# Patient Record
Sex: Female | Born: 1985 | Race: Black or African American | Hispanic: No | Marital: Married | Smoking: Never smoker
Health system: Southern US, Community
[De-identification: ages and names within clinical notes are randomized; demographics above are authoritative.]

## PROBLEM LIST (undated history)

## (undated) DIAGNOSIS — Z789 Other specified health status: Secondary | ICD-10-CM

## (undated) HISTORY — PX: NO PAST SURGERIES: SHX2092

---

## 2019-04-07 NOTE — L&D Delivery Note (Signed)
Delivery Note At 4:13 AM a viable and healthy female was delivered via Vaginal, Spontaneous (Presentation: Right Occiput Anterior).  APGAR: 8, 8; weight 3 lb 13.4 oz (1740 g).   Placenta status: Spontaneous, Intact.  Cord: 3 vessels with the following complications: None.  Cord pH: na  Anesthesia: Epidural Episiotomy: None Lacerations: 1st degree Suture Repair: 2.0 vicryl rapide Est. Blood Loss (mL): 100  Mom to postpartum.  Baby to NICU.  Khaliq Turay J 02/17/2020, 4:38 AM

## 2019-09-07 LAB — OB RESULTS CONSOLE RPR: RPR: NONREACTIVE

## 2019-09-07 LAB — OB RESULTS CONSOLE HEPATITIS B SURFACE ANTIGEN: Hepatitis B Surface Ag: NEGATIVE

## 2019-09-07 LAB — OB RESULTS CONSOLE RUBELLA ANTIBODY, IGM: Rubella: IMMUNE

## 2019-09-07 LAB — OB RESULTS CONSOLE HIV ANTIBODY (ROUTINE TESTING): HIV: NONREACTIVE

## 2019-09-15 ENCOUNTER — Inpatient Hospital Stay (HOSPITAL_COMMUNITY): Admission: AD | Admit: 2019-09-15 | Payer: 59 | Source: Home / Self Care | Admitting: Obstetrics

## 2020-02-05 ENCOUNTER — Encounter: Payer: Self-pay | Admitting: Student

## 2020-02-05 ENCOUNTER — Other Ambulatory Visit: Payer: Self-pay

## 2020-02-05 ENCOUNTER — Inpatient Hospital Stay (HOSPITAL_COMMUNITY)
Admission: AD | Admit: 2020-02-05 | Discharge: 2020-02-21 | DRG: 807 | Disposition: A | Discharge status: Home / Self Care | Payer: 59 | Attending: Obstetrics and Gynecology | Admitting: Obstetrics and Gynecology

## 2020-02-05 ENCOUNTER — Inpatient Hospital Stay (HOSPITAL_BASED_OUTPATIENT_CLINIC_OR_DEPARTMENT_OTHER): Payer: 59

## 2020-02-05 DIAGNOSIS — Z20822 Contact with and (suspected) exposure to covid-19: Secondary | ICD-10-CM | POA: Diagnosis present

## 2020-02-05 DIAGNOSIS — O365930 Maternal care for other known or suspected poor fetal growth, third trimester, not applicable or unspecified: Secondary | ICD-10-CM

## 2020-02-05 DIAGNOSIS — O1414 Severe pre-eclampsia complicating childbirth: Principal | ICD-10-CM | POA: Diagnosis present

## 2020-02-05 DIAGNOSIS — Z363 Encounter for antenatal screening for malformations: Secondary | ICD-10-CM | POA: Diagnosis not present

## 2020-02-05 DIAGNOSIS — O99214 Obesity complicating childbirth: Secondary | ICD-10-CM | POA: Diagnosis present

## 2020-02-05 DIAGNOSIS — Z3A33 33 weeks gestation of pregnancy: Secondary | ICD-10-CM | POA: Diagnosis not present

## 2020-02-05 DIAGNOSIS — Z362 Encounter for other antenatal screening follow-up: Secondary | ICD-10-CM | POA: Diagnosis not present

## 2020-02-05 DIAGNOSIS — O1413 Severe pre-eclampsia, third trimester: Secondary | ICD-10-CM | POA: Diagnosis not present

## 2020-02-05 DIAGNOSIS — Z3A32 32 weeks gestation of pregnancy: Secondary | ICD-10-CM | POA: Diagnosis not present

## 2020-02-05 DIAGNOSIS — R030 Elevated blood-pressure reading, without diagnosis of hypertension: Secondary | ICD-10-CM | POA: Diagnosis present

## 2020-02-05 DIAGNOSIS — O1410 Severe pre-eclampsia, unspecified trimester: Secondary | ICD-10-CM | POA: Diagnosis present

## 2020-02-05 DIAGNOSIS — O133 Gestational [pregnancy-induced] hypertension without significant proteinuria, third trimester: Secondary | ICD-10-CM

## 2020-02-05 HISTORY — DX: Other specified health status: Z78.9

## 2020-02-05 LAB — COMPREHENSIVE METABOLIC PANEL
ALT: 11 U/L (ref 0–44)
AST: 22 U/L (ref 15–41)
Albumin: 2.5 g/dL — ABNORMAL LOW (ref 3.5–5.0)
Alkaline Phosphatase: 89 U/L (ref 38–126)
Anion gap: 6 (ref 5–15)
BUN: 10 mg/dL (ref 6–20)
CO2: 24 mmol/L (ref 22–32)
Calcium: 8.5 mg/dL — ABNORMAL LOW (ref 8.9–10.3)
Chloride: 105 mmol/L (ref 98–111)
Creatinine, Ser: 0.76 mg/dL (ref 0.44–1.00)
GFR, Estimated: >60 mL/min (ref >60)
Glucose, Bld: 76 mg/dL (ref 70–99)
Potassium: 4.2 mmol/L (ref 3.5–5.1)
Sodium: 135 mmol/L (ref 135–145)
Total Bilirubin: 0.5 mg/dL (ref 0.3–1.2)
Total Protein: 5.4 g/dL — ABNORMAL LOW (ref 6.5–8.1)

## 2020-02-05 LAB — CBC
HCT: 35 % — ABNORMAL LOW (ref 36.0–46.0)
Hemoglobin: 11.8 g/dL — ABNORMAL LOW (ref 12.0–15.0)
MCH: 31.1 pg (ref 26.0–34.0)
MCHC: 33.7 g/dL (ref 30.0–36.0)
MCV: 92.1 fL (ref 80.0–100.0)
Platelets: 254 10*3/uL (ref 150–400)
RBC: 3.8 MIL/uL — ABNORMAL LOW (ref 3.87–5.11)
RDW: 13 % (ref 11.5–15.5)
WBC: 9.1 10*3/uL (ref 4.0–10.5)
nRBC: 0.6 % — ABNORMAL HIGH (ref 0.0–0.2)

## 2020-02-05 LAB — PROTEIN / CREATININE RATIO, URINE
Creatinine, Urine: 82.68 mg/dL
Protein Creatinine Ratio: 13.47 mg/mg{Cre} — ABNORMAL HIGH (ref 0.00–0.15)
Total Protein, Urine: 1114 mg/dL

## 2020-02-05 LAB — RESPIRATORY PANEL BY RT PCR (FLU A&B, COVID)
Influenza A by PCR: NEGATIVE
Influenza B by PCR: NEGATIVE
SARS Coronavirus 2 by RT PCR: NEGATIVE

## 2020-02-05 LAB — TYPE AND SCREEN
ABO/RH(D): O POS
Antibody Screen: NEGATIVE

## 2020-02-05 MED ORDER — DOCUSATE SODIUM 100 MG PO CAPS
100.0000 mg | ORAL_CAPSULE | Freq: Every day | ORAL | Status: DC
  Administered 2020-02-06 – 2020-02-15 (×10): 100 mg via ORAL
  Filled 2020-02-05 (×10): qty 1

## 2020-02-05 MED ORDER — LABETALOL HCL 5 MG/ML IV SOLN
40.0000 mg | INTRAVENOUS | Status: DC | PRN
  Administered 2020-02-05 – 2020-02-06 (×2): 40 mg via INTRAVENOUS
  Filled 2020-02-05 (×2): qty 8

## 2020-02-05 MED ORDER — DIPHENHYDRAMINE HCL 25 MG PO CAPS
25.0000 mg | ORAL_CAPSULE | Freq: Once | ORAL | Status: AC
  Administered 2020-02-05: 25 mg via ORAL
  Filled 2020-02-05: qty 1

## 2020-02-05 MED ORDER — LABETALOL HCL 5 MG/ML IV SOLN
20.0000 mg | INTRAVENOUS | Status: DC | PRN
  Administered 2020-02-05 – 2020-02-16 (×6): 20 mg via INTRAVENOUS
  Filled 2020-02-05 (×7): qty 4

## 2020-02-05 MED ORDER — ZOLPIDEM TARTRATE 5 MG PO TABS
5.0000 mg | ORAL_TABLET | Freq: Every evening | ORAL | Status: DC | PRN
  Administered 2020-02-06: 5 mg via ORAL
  Filled 2020-02-05: qty 1

## 2020-02-05 MED ORDER — HYDRALAZINE HCL 20 MG/ML IJ SOLN
10.0000 mg | Freq: Once | INTRAMUSCULAR | Status: AC
  Administered 2020-02-05: 10 mg via INTRAVENOUS

## 2020-02-05 MED ORDER — ACETAMINOPHEN 325 MG PO TABS
650.0000 mg | ORAL_TABLET | ORAL | Status: DC | PRN
  Administered 2020-02-05: 650 mg via ORAL
  Filled 2020-02-05: qty 2

## 2020-02-05 MED ORDER — LABETALOL HCL 5 MG/ML IV SOLN
80.0000 mg | INTRAVENOUS | Status: DC | PRN
  Administered 2020-02-05: 80 mg via INTRAVENOUS
  Filled 2020-02-05 (×2): qty 16

## 2020-02-05 MED ORDER — HYDRALAZINE HCL 20 MG/ML IJ SOLN
10.0000 mg | INTRAMUSCULAR | Status: DC | PRN
  Administered 2020-02-05: 10 mg via INTRAVENOUS
  Filled 2020-02-05 (×2): qty 1

## 2020-02-05 MED ORDER — MAGNESIUM SULFATE BOLUS VIA INFUSION
4.0000 g | Freq: Once | INTRAVENOUS | Status: AC
  Administered 2020-02-05: 4 g via INTRAVENOUS
  Filled 2020-02-05: qty 1000

## 2020-02-05 MED ORDER — CALCIUM CARBONATE ANTACID 500 MG PO CHEW: 2.0000 | CHEWABLE_TABLET | ORAL | Status: DC | PRN

## 2020-02-05 MED ORDER — MAGNESIUM SULFATE 40 GM/1000ML IV SOLN
2.0000 g/h | INTRAVENOUS | Status: AC
  Administered 2020-02-06: 2 g/h via INTRAVENOUS
  Filled 2020-02-05 (×2): qty 1000

## 2020-02-05 MED ORDER — LABETALOL HCL 100 MG PO TABS
100.0000 mg | ORAL_TABLET | Freq: Three times a day (TID) | ORAL | Status: DC
  Administered 2020-02-05 – 2020-02-06 (×4): 100 mg via ORAL
  Filled 2020-02-05 (×4): qty 1

## 2020-02-05 MED ORDER — PRENATAL MULTIVITAMIN CH
1.0000 | ORAL_TABLET | Freq: Every day | ORAL | Status: DC
  Administered 2020-02-07 – 2020-02-15 (×9): 1 via ORAL
  Filled 2020-02-05 (×10): qty 1

## 2020-02-05 MED ORDER — LACTATED RINGERS IV SOLN: INTRAVENOUS | Status: AC

## 2020-02-05 NOTE — MAU Note (Signed)
Sent from MD office for BP evaluation.  Reports BP began going up 2 weeks ago.  Denies H/A, visual disturbances and epigastric pain.  States has swelling feet.  Denies VB or LOF.  Endorses +FM.

## 2020-02-05 NOTE — H&P (Signed)
Samantha [name] is a 34 y.o. female presenting for BP elevation in office. Originally seen 2w ago with inc proteinuria and nl BP. P/C ratio elevated. Nl BPs at that time. Presented to office then MAU today with severe range BPs for evaluation and now admission. Required IV anti HTN in MAU. No headaches, CP, SOB or visual changes. Nl labs. Fetal evaluation reassuring.  OB History    Gravida  1   Para      Term      Preterm      AB      Living        SAB      TAB      Ectopic      Multiple      Live Births             Past Medical History:  Diagnosis Date  . Medical history non-contributory    Past Surgical History:  Procedure Laterality Date  . NO PAST SURGERIES     Family History: family history is not on file. Social History:  reports that she has never smoked. She has never used smokeless tobacco. She reports that she does not use drugs. No history on file for alcohol use.     Maternal Diabetes: No Genetic Screening: Normal Maternal Ultrasounds/Referrals: Normal Fetal Ultrasounds or other Referrals:  None Maternal Substance Abuse:  No Significant Maternal Medications:  None Significant Maternal Lab Results:  Other: pending Other Comments:  None  Review of Systems  Constitutional: Negative.   Respiratory: Negative.   Cardiovascular: Negative.   Gastrointestinal: Negative.   Neurological: Negative.   All other systems reviewed and are negative.  Maternal Medical History:  Fetal activity: Perceived fetal activity is normal.    Prenatal complications: PIH.   Prenatal Complications - Diabetes: none.      Blood pressure (!) 161/95, pulse 91, temperature 98.9 F (37.2 C), temperature source Oral, resp. rate 18, height 5\' 7"  (1.702 m), weight 112.7 kg, SpO2 99 %. Maternal Exam:  Uterine Assessment: Contraction strength is mild.  Contraction frequency is rare.   Abdomen: Patient reports no abdominal tenderness. Fetal presentation: vertex  Introitus:  Normal vulva. Normal vagina.  Ferning test: not done.  Nitrazine test: not done. Amniotic fluid character: not assessed.  Pelvis: adequate for delivery.   Cervix: not evaluated.   Verbal report: EFW 10th percentile. AFI nl. Nl  UAD. VTX. BPP 8/8.   CBC    Component Value Date/Time   WBC 9.1 02/05/2020 1208   RBC 3.80 (L) 02/05/2020 1208   HGB 11.8 (L) 02/05/2020 1208   HCT 35.0 (L) 02/05/2020 1208   PLT 254 02/05/2020 1208   MCV 92.1 02/05/2020 1208   MCH 31.1 02/05/2020 1208   MCHC 33.7 02/05/2020 1208   RDW 13.0 02/05/2020 1208    CMP     Component Value Date/Time   NA 135 02/05/2020 1208   K 4.2 02/05/2020 1208   CL 105 02/05/2020 1208   CO2 24 02/05/2020 1208   GLUCOSE 76 02/05/2020 1208   BUN 10 02/05/2020 1208   CREATININE 0.76 02/05/2020 1208   CALCIUM 8.5 (L) 02/05/2020 1208   PROT 5.4 (L) 02/05/2020 1208   ALBUMIN 2.5 (L) 02/05/2020 1208   AST 22 02/05/2020 1208   ALT 11 02/05/2020 1208   ALKPHOS 89 02/05/2020 1208   BILITOT 0.5 02/05/2020 1208   GFRNONAA >60 02/05/2020 1208    Physical Exam Vitals and nursing note reviewed.  Constitutional:  Appearance: Normal appearance.  HENT:     Head: Normocephalic and atraumatic.  Cardiovascular:     Rate and Rhythm: Normal rate and regular rhythm.     Pulses: Normal pulses.     Heart sounds: Normal heart sounds.  Pulmonary:     Effort: Pulmonary effort is normal.     Breath sounds: Normal breath sounds.  Abdominal:     Palpations: Abdomen is soft.  Genitourinary:    General: Normal vulva.  Musculoskeletal:        General: Normal range of motion.     Cervical back: Normal range of motion and neck supple.  Skin:    General: Skin is warm and dry.  Neurological:     General: No focal deficit present.     Mental Status: She is alert and oriented to person, place, and time.  Psychiatric:        Mood and Affect: Mood normal.        Behavior: Behavior normal.     Prenatal labs: ABO, Rh: --/--/O POS  (11/01 1159) Antibody: NEG (11/01 1159) Rubella:  immune RPR:   neg HBsAg:   neg HIV:   neg GBS:   pending  Assessment/Plan: 32w 3d IUP PEC with severe features. BP criteria only. No neurologic sxs. Nl labs. BMZ complete 10/28 and 10/29.  Admit for MGSO4 x 24hrs.  Serial labs Start Labetalol 100mg  tid I/Os MFM consuilt   Samantha [name] J 02/05/2020, 2:41 PM

## 2020-02-05 NOTE — MAU Provider Note (Signed)
History     CSN: [id number]  Arrival date and time: 02/05/20 1109   First Provider Initiated Contact with Patient 02/05/20 1157      Chief Complaint  Patient presents with   BP Evaluation   HPI Samantha [name] is a 34 y.o. G1P0 at [redacted]w[redacted]d who presents with hypertension. Denies any history of hypertension before last week. Last week she was diagnosed with preeclampsia due to new onset elevated BPs & proteinuria; had a PCR >5. Was given BMZ on 10/28 & 10/29 in the office.  Denies headache, visual disturbance, or epigastric pain. Denies contractions, vaginal bleeding, or LOF. Good fetal movement.    OB History     Gravida  1   Para      Term      Preterm      AB      Living         SAB      TAB      Ectopic      Multiple      Live Births              Past Medical History:  Diagnosis Date   Medical history non-contributory     Past Surgical History:  Procedure Laterality Date   NO PAST SURGERIES      History reviewed. No pertinent family history.  Social History   Tobacco Use   Smoking status: Never Smoker   Smokeless tobacco: Never Used  Vaping Use   Vaping Use: Never used  Substance Use Topics   Alcohol use: Not on file   Drug use: Never    Allergies: No Known Allergies  Medications Prior to Admission  Medication Sig Dispense Refill Last Dose   Prenatal Vit-Fe Fumarate-FA (MULTIVITAMIN-PRENATAL) 27-0.8 MG TABS tablet Take 1 tablet by mouth daily at 12 noon.   02/05/2020 at 0730   FEROSUL 325 (65 Fe) MG tablet TAKE ONE TABLET BY MOUTH ONE TIME DAILY WITH VITAMIN C OR ORANGE JUICE. USE COLACE AS NEEDED FOR CONSTIPATION       Review of Systems  Constitutional: Negative.   Eyes: Negative for visual disturbance.  Gastrointestinal: Negative.   Genitourinary: Negative.   Neurological: Negative for headaches.   Physical Exam   Blood pressure (!) 152/88, pulse 90, temperature 98.1 F (36.7 C), temperature source Oral, resp. rate  18, height 5\' 7"  (1.702 m), weight 112.7 kg, SpO2 99 %. Patient Vitals for the past 24 hrs:  BP Temp Temp src Pulse Resp SpO2 Height Weight  02/05/20 1445 (!) 152/88 -- -- 90 18 99 % -- --  02/05/20 1440 (!) 157/89 98.1 F (36.7 C) Oral 90 20 99 % -- --  02/05/20 1435 -- -- -- -- -- 99 % -- --  02/05/20 1430 (!) 161/95 -- -- 91 18 99 % -- --  02/05/20 1425 -- -- -- -- 18 100 % -- --  02/05/20 1420 (!) 168/99 -- -- 87 20 100 % -- --  02/05/20 1415 (!) 170/86 -- -- 86 -- 100 % -- --  02/05/20 1316 139/75 -- -- 87 -- -- -- --  02/05/20 1300 (!) 169/105 -- -- 78 -- -- -- --  02/05/20 1245 (!) 157/93 -- -- 81 -- -- -- --  02/05/20 1230 (!) 156/104 -- -- 82 -- -- -- --  02/05/20 1216 (!) 161/98 -- -- 82 20 -- -- --  02/05/20 1205 (!) 168/106 -- -- 91 18 -- -- --  02/05/20 1146 (!) 163/108 -- --  39 -- -- -- --  02/05/20 1142 -- -- -- -- -- -- 5\' 7"  (1.702 m) 112.7 kg  02/05/20 1140 -- -- -- -- -- 99 % -- --  02/05/20 1139 (!) 168/107 98.9 F (37.2 C) Oral 86 20 -- -- --    Physical Exam Vitals and nursing note reviewed.  Constitutional:      General: She is not in acute distress.    Appearance: Normal appearance.  HENT:     Head: Normocephalic and atraumatic.  Cardiovascular:     Rate and Rhythm: Normal rate and regular rhythm.     Heart sounds: Normal heart sounds.  Pulmonary:     Effort: Pulmonary effort is normal. No respiratory distress.     Breath sounds: Normal breath sounds. No wheezing.  Abdominal:     Tenderness: There is no abdominal tenderness.     Comments: Gravid uterus  Musculoskeletal:     Right lower leg: 2+ Pitting Edema present.     Left lower leg: 2+ Pitting Edema present.  Skin:    General: Skin is warm and dry.  Neurological:     Mental Status: She is alert.     Deep Tendon Reflexes:     Reflex Scores:      Patellar reflexes are 2+ on the right side and 2+ on the left side.    Comments: No clonus  Psychiatric:        Mood and Affect: Mood normal.         Behavior: Behavior normal.    Fetal Tracing:  Baseline: 140 Variability: moderate Accelerations: 15x15 Decelerations: none  Toco: irregular   MAU Course  Procedures Results for orders placed or performed during the hospital encounter of 02/05/20 (from the past 24 hour(s))  Protein / creatinine ratio, urine     Status: Abnormal   Collection Time: 02/05/20 11:46 AM  Result Value Ref Range   Creatinine, Urine 82.68 mg/dL   Total Protein, Urine 1,114 mg/dL   Protein Creatinine Ratio 13.47 (H) 0.00 - 0.15 mg/mg[Cre]  Type and screen     Status: None   Collection Time: 02/05/20 11:59 AM  Result Value Ref Range   ABO/RH(D) O POS    Antibody Screen NEG    Sample Expiration      02/08/2020,2359 Performed at [facility], [address] [place], [place]   Comprehensive metabolic panel     Status: Abnormal   Collection Time: 02/05/20 12:08 PM  Result Value Ref Range   Sodium 135 135 - 145 mmol/L   Potassium 4.2 3.5 - 5.1 mmol/L   Chloride 105 98 - 111 mmol/L   CO2 24 22 - 32 mmol/L   Glucose, Bld 76 70 - 99 mg/dL   BUN 10 6 - 20 mg/dL   Creatinine, Ser 13/01/21 0.44 - 1.00 mg/dL   Calcium 8.5 (L) 8.9 - 10.3 mg/dL   Total Protein 5.4 (L) 6.5 - 8.1 g/dL   Albumin 2.5 (L) 3.5 - 5.0 g/dL   AST 22 15 - 41 U/L   ALT 11 0 - 44 U/L   Alkaline Phosphatase 89 38 - 126 U/L   Total Bilirubin 0.5 0.3 - 1.2 mg/dL   GFR, Estimated 7.41 >28 mL/min   Anion gap 6 5 - 15  CBC     Status: Abnormal   Collection Time: 02/05/20 12:08 PM  Result Value Ref Range   WBC 9.1 4.0 - 10.5 K/uL   RBC 3.80 (L) 3.87 -  5.11 MIL/uL   Hemoglobin 11.8 (L) 12.0 - 15.0 g/dL   HCT 86.5 (L) 36 - 46 %   MCV 92.1 80.0 - 100.0 fL   MCH 31.1 26.0 - 34.0 pg   MCHC 33.7 30.0 - 36.0 g/dL   RDW 78.4 69.6 - 29.5 %   Platelets 254 150 - 400 K/uL   nRBC 0.6 (H) 0.0 - 0.2 %   No results found.  MDM Patient presents with severe range BPs. She is currently asymptomatic. BPs treated with IV  antihypertensive protocol & patient ultimately started on mag.   S/w Drs. Taavon & Almquist regarding patient. Patient to be admitted to [facility] unit after growth ultrasound done. Will plan for MFM & NICU consults. Will start on oral labetalol 100 mg TID.   Dr. Billy Coast notified of preliminary ultrasound report & patient transported to [facility] unit  Assessment and Plan   1. Preeclampsia, severe   2. [redacted] weeks gestation of pregnancy    -admit to [facility] unit -s/p BMZ series 10/28 & 10/29  Judeth Horn 02/05/2020, 11:57 AM

## 2020-02-06 ENCOUNTER — Inpatient Hospital Stay: Payer: 59

## 2020-02-06 DIAGNOSIS — Z3A32 32 weeks gestation of pregnancy: Secondary | ICD-10-CM | POA: Diagnosis not present

## 2020-02-06 DIAGNOSIS — O1413 Severe pre-eclampsia, third trimester: Secondary | ICD-10-CM | POA: Diagnosis not present

## 2020-02-06 LAB — CBC WITH DIFFERENTIAL/PLATELET
Abs Immature Granulocytes: 0.08 10*3/uL — ABNORMAL HIGH (ref 0.00–0.07)
Basophils Absolute: 0 10*3/uL (ref 0.0–0.1)
Basophils Relative: 0 %
Eosinophils Absolute: 0.1 10*3/uL (ref 0.0–0.5)
Eosinophils Relative: 1 %
HCT: 34.5 % — ABNORMAL LOW (ref 36.0–46.0)
Hemoglobin: 11.7 g/dL — ABNORMAL LOW (ref 12.0–15.0)
Immature Granulocytes: 1 %
Lymphocytes Relative: 22 %
Lymphs Abs: 2 10*3/uL (ref 0.7–4.0)
MCH: 30.7 pg (ref 26.0–34.0)
MCHC: 33.9 g/dL (ref 30.0–36.0)
MCV: 90.6 fL (ref 80.0–100.0)
Monocytes Absolute: 0.6 10*3/uL (ref 0.1–1.0)
Monocytes Relative: 7 %
Neutro Abs: 6.2 10*3/uL (ref 1.7–7.7)
Neutrophils Relative %: 69 %
Platelets: 245 10*3/uL (ref 150–400)
RBC: 3.81 MIL/uL — ABNORMAL LOW (ref 3.87–5.11)
RDW: 13 % (ref 11.5–15.5)
WBC: 9.1 10*3/uL (ref 4.0–10.5)
nRBC: 0.2 % (ref 0.0–0.2)

## 2020-02-06 LAB — COMPREHENSIVE METABOLIC PANEL
ALT: 12 U/L (ref 0–44)
AST: 18 U/L (ref 15–41)
Albumin: 2.3 g/dL — ABNORMAL LOW (ref 3.5–5.0)
Alkaline Phosphatase: 98 U/L (ref 38–126)
Anion gap: 8 (ref 5–15)
BUN: 8 mg/dL (ref 6–20)
CO2: 22 mmol/L (ref 22–32)
Calcium: 7.4 mg/dL — ABNORMAL LOW (ref 8.9–10.3)
Chloride: 102 mmol/L (ref 98–111)
Creatinine, Ser: 0.83 mg/dL (ref 0.44–1.00)
GFR, Estimated: >60 mL/min (ref >60)
Glucose, Bld: 94 mg/dL (ref 70–99)
Potassium: 4.2 mmol/L (ref 3.5–5.1)
Sodium: 132 mmol/L — ABNORMAL LOW (ref 135–145)
Total Bilirubin: 0.5 mg/dL (ref 0.3–1.2)
Total Protein: 5.3 g/dL — ABNORMAL LOW (ref 6.5–8.1)

## 2020-02-06 MED ORDER — LABETALOL HCL 200 MG PO TABS
200.0000 mg | ORAL_TABLET | Freq: Three times a day (TID) | ORAL | Status: DC
  Administered 2020-02-06 – 2020-02-09 (×9): 200 mg via ORAL
  Filled 2020-02-06 (×9): qty 1

## 2020-02-06 MED ORDER — NIFEDIPINE ER OSMOTIC RELEASE 30 MG PO TB24
30.0000 mg | ORAL_TABLET | Freq: Two times a day (BID) | ORAL | Status: DC
  Administered 2020-02-06 – 2020-02-19 (×27): 30 mg via ORAL
  Filled 2020-02-06 (×28): qty 1

## 2020-02-06 MED ORDER — SODIUM CHLORIDE 0.9 % IV SOLN
8.0000 mg | Freq: Once | INTRAVENOUS | Status: AC
  Administered 2020-02-06: 8 mg via INTRAVENOUS
  Filled 2020-02-06: qty 4

## 2020-02-06 MED ORDER — LABETALOL HCL 100 MG PO TABS
100.0000 mg | ORAL_TABLET | Freq: Once | ORAL | Status: AC
  Administered 2020-02-06: 100 mg via ORAL
  Filled 2020-02-06: qty 1

## 2020-02-06 NOTE — Progress Notes (Addendum)
HD 2 PEC with severe features 32w 4d  S: No complaints on mag. Good FM, No contractions, bleeding or LOF. Denies HA , CP or SOB. No visual changes. Occ nausea since magnesium started.   O: BP (!) 148/100 (BP Location: Left Arm)   Pulse 90   Temp 98.4 F (36.9 C) (Oral)   Resp 20   Ht 5\' 7"  (1.702 m)   Wt 112.7 kg   SpO2 98%   BMI 38.92 kg/m   NCAT HEENT : nl Neck: supple with FROM Lungs: CTA, NO WRR CV: RRR ABD: Gravid, NT, No RUQ tenderness No CVAT EXT: 1+ pretibial edema, DTRs 2+ Neuro: nonfocal Skin: intact  CBC, CMP pending this am I/Os 1766/1850 Sono c/w EFW 10th percentile, nl AFI. BPP 8/8.  FHR 120-130 , BTBV 5-25, accels 15x 15, category 1  IMP: 32w 4d IUP PEC with severe features (BP criteria). Nl labs. Reassuring fetal surveillance. BMZ complete.   P: Continue expectant management DC Mag at 24 hrs. Labetalol 100mg  tid (started yesterday) Nifedipine 30mg  XL bid (started this am) Rpt labs this am NICU consult Cont EFM /TOCO until off mag then tid Rpt BPP Thursday and continue twice weekly Remain inpt until delivery Delivery goal 34 wks

## 2020-02-06 NOTE — Consult Note (Signed)
MFM Note  Samantha Samantha [name] is Samantha [name] 34 year old gravida 1 para 0 currently at 32 weeks and 4 days.  She was admitted yesterday due to preeclampsia with severe range blood pressures that required treatment with IV antihypertensive medications.  She was noted to have significant proteinuria in the office about 2 weeks ago.  Her blood pressures have increased since that time.  Due to preeclampsia, the patient received Samantha [name] complete course of antenatal corticosteroids on October 28 and 29.  The patient denies any history of hypertension outside of pregnancy.  She denies any other problems in her current pregnancy.   Due to severe preeclampsia, she was admitted to the hospital and started on magnesium sulfate for maternal seizure prophylaxis.  She was also started on labetalol 100 mg three times Samantha [name] day and nifedipine 30 mg XL daily for blood pressure control.  Her blood pressures are currently in the 140s to 150s over 90s range.  She denies any signs or symptoms of severe preeclampsia.  She had an ultrasound performed yesterday showing an EFW of 3 pounds 12 ounces (10th percentile for her gestational age).  There was normal amniotic fluid.  Doppler studies of the umbilical arteries showed Samantha [name] normal S/D ratio of 2.5.  There were no signs of absent or reversed end-diastolic flow.  Her preeclampsia labs have all been within normal limits.  Her P/C ratio predicts 1114 mg of protein.  The implications and management of preeclampsia was discussed with the patient and her partner. They were advised that preeclampsia can affect both the mother and the fetus.  In the mother, preeclampsia may cause Samantha [name] rise in blood pressures and it can affect the mother's kidney, liver and platelet functions.  It may also cause the mother to have seizures.  In the fetus, it may cause growth restriction and oligohydramnios.  She understands that delivery is the only treatment for preeclampsia.  Due to preeclampsia with severe range blood  pressures, I would recommend inpatient management until delivery.  She may continue the antihypertensive medications (labetalol and nifedipine) for blood pressure control so that she may reach Samantha [name] more optimal gestational age for delivery.  The goal for her delivery would be 34 weeks.  As it will be more than 1 week since she received her initial course of antenatal corticosteroids, I would recommend that she receive Samantha [name] rescue course of steroids prior to delivery at 34 weeks.  While being observed in the hospital, she should have PIH labs monitored twice Samantha [name] week and biophysical profiles twice Samantha [name] week.  Delivery prior to 34 weeks would be indicated should she complain of any signs or symptoms of preeclampsia, should her blood pressures be persistently elevated above the 160/100's range despite medication treatment, should she require IV push antihypertensive medications to control her blood pressures, should there be nonreassuring fetal status, or should her preeclampsia labs show any abnormalities.    The patient understands that her baby will require Samantha [name] NICU admission for delivery at her current gestational age.  At the end of the consultation, the patient stated that all of her questions have been answered to her complete satisfaction.    Thank you for referring this patient for Samantha [name] Maternal-Fetal Medicine Consultation.    Total time spent in consultation: 60 minutes  Recommendations:  Inpatient management until delivery Continue labetalol and nifedipine for blood pressure control Daily fetal testing  Twice weekly preeclampsia labs while hospitalized Rescue course of corticosteroids prior to delivery Delivery at 34 weeks Magnesium sulfate should  be given for maternal seizure prophylaxis at the time of delivery Delivery prior to 34 weeks would be indicated:   Should her blood pressures be persistently greater than 160/100  Should she require IV push medication for blood pressure control  Should  she complain of any signs and symptoms of severe preeclampsia  Should her preeclampsia labs show any abnormalities  At any time for nonreassuring fetal status

## 2020-02-07 LAB — OB RESULTS CONSOLE GBS: GBS: NEGATIVE

## 2020-02-07 MED ORDER — SODIUM CHLORIDE 0.9% FLUSH
3.0000 mL | Freq: Two times a day (BID) | INTRAVENOUS | Status: DC
Start: 2020-02-07 — End: 2020-02-16
  Administered 2020-02-07 – 2020-02-15 (×17): 3 mL via INTRAVENOUS

## 2020-02-07 NOTE — Progress Notes (Signed)
Hospital day #3.  Preeclampsia with severe features.  32 weeks, 5 days  Subjective: Patient notes no headache, no vision change, no right upper quadrant pain.  Patient notes voiding normally.  Patient denies chest pain or shortness of breath though she does note prior to admission she was having some nasal dryness and trouble breathing with sleep.  This is not occurring currently.  Patient tolerating regular p.o.  Patient notes good fetal movement, no vaginal bleeding, no leaking fluid, no contractions  Objective Vitals with BMI 02/07/2020 02/07/2020 02/06/2020  Height - - -  Weight - - -  BMI - - -  Systolic [phone]  Diastolic 83 82 70  Pulse 84 86 92   General: Well-appearing, no distress Pulmonary: Clear to auscultation bilaterally, no crackles or wheezes Cardiovascular: Regular rate and rhythm Abdomen: Gravid, nontender, no fundal tenderness, no right upper quadrant pain GU: Deferred Lower extremity: 2+ pitting edema, 1+ DTR, no clonus  CBC Latest Ref Rng & Units 02/06/2020 02/05/2020  WBC 4.0 - 10.5 K/uL 9.1 9.1  Hemoglobin 12.0 - 15.0 g/dL 11.7(L) 11.8(L)  Hematocrit 36 - 46 % 34.5(L) 35.0(L)  Platelets 150 - 400 K/uL 245 254   CMP Latest Ref Rng & Units 02/06/2020 02/05/2020  Glucose 70 - 99 mg/dL 94 76  BUN 6 - 20 mg/dL 8 10  Creatinine 0.44 - 1.00 mg/dL 0.83 0.76  Sodium 135 - 145 mmol/L 132(L) 135  Potassium 3.5 - 5.1 mmol/L 4.2 4.2  Chloride 98 - 111 mmol/L 102 105  CO2 22 - 32 mmol/L 22 24  Calcium 8.9 - 10.3 mg/dL 7.4(L) 8.5(L)  Total Protein 6.5 - 8.1 g/dL 5.3(L) 5.4(L)  Total Bilirubin 0.3 - 1.2 mg/dL 0.5 0.5  Alkaline Phos 38 - 126 U/L 98 89  AST 15 - 41 U/L 18 22  ALT 0 - 44 U/L 12 11    Report of 24-hour urine from office, 12 g of protein Ultrasound 11/1: AFI 15, vertex, BPP 8 out of 8, growth 1710 g, 3 pounds 12 ounces, 10th percentile  Toco: None FH: 120s, positive accelerations, no decelerations, 10 beat variability  Assessment and plan: 34 year old  G1 P0 at 32 weeks and 5 days, with no prior history of hypertension now with preeclampsia with severe features by severe range blood pressures, normal labs, fetal growth in the 10th percentile  Severe preeclampsia.  Betamethasone complete.  Continue to work with blood pressure control.  Patient did need 2 doses of IV labetalol at 20 and 40 mg yesterday.  At that time her oral meds were changed from labetalol 102 to 100 mg every 8 hours.  She has maintained on her Procardia 30 mg XL twice daily.  With new increase in blood pressures will plan to give IV antihypertensives as needed and can continue to adjust her oral regimen.  We will plan for labs twice weekly.  Do not need to recheck urine as she has already met criteria for proteinuria.  We will plan fetal monitoring every shift with BPP twice weekly.  Labs have been ordered for every Monday and Thursday.  BPP has been requested for every Monday and Thursday.  Patient is status post MFM consult who recommends delivery at 34 weeks.  They also recommend repeating the betamethasone 2 dose course prior to delivery.  Patient should have magnesium at the time of delivery.  Will send GBS culture now.  Ala Dach 02/07/2020 11:58 AM

## 2020-02-08 ENCOUNTER — Inpatient Hospital Stay (HOSPITAL_BASED_OUTPATIENT_CLINIC_OR_DEPARTMENT_OTHER): Payer: 59

## 2020-02-08 DIAGNOSIS — Z363 Encounter for antenatal screening for malformations: Secondary | ICD-10-CM | POA: Diagnosis not present

## 2020-02-08 DIAGNOSIS — O365930 Maternal care for other known or suspected poor fetal growth, third trimester, not applicable or unspecified: Secondary | ICD-10-CM

## 2020-02-08 DIAGNOSIS — Z3A32 32 weeks gestation of pregnancy: Secondary | ICD-10-CM | POA: Diagnosis not present

## 2020-02-08 DIAGNOSIS — O1413 Severe pre-eclampsia, third trimester: Secondary | ICD-10-CM | POA: Diagnosis not present

## 2020-02-08 LAB — COMPREHENSIVE METABOLIC PANEL
ALT: 12 U/L (ref 0–44)
AST: 18 U/L (ref 15–41)
Albumin: 2.1 g/dL — ABNORMAL LOW (ref 3.5–5.0)
Alkaline Phosphatase: 92 U/L (ref 38–126)
Anion gap: 7 (ref 5–15)
BUN: 12 mg/dL (ref 6–20)
CO2: 23 mmol/L (ref 22–32)
Calcium: 8.2 mg/dL — ABNORMAL LOW (ref 8.9–10.3)
Chloride: 106 mmol/L (ref 98–111)
Creatinine, Ser: 0.78 mg/dL (ref 0.44–1.00)
GFR, Estimated: >60 mL/min (ref >60)
Glucose, Bld: 73 mg/dL (ref 70–99)
Potassium: 4 mmol/L (ref 3.5–5.1)
Sodium: 136 mmol/L (ref 135–145)
Total Bilirubin: 0.5 mg/dL (ref 0.3–1.2)
Total Protein: 5 g/dL — ABNORMAL LOW (ref 6.5–8.1)

## 2020-02-08 LAB — CBC WITH DIFFERENTIAL/PLATELET
Abs Immature Granulocytes: 0.04 10*3/uL (ref 0.00–0.07)
Basophils Absolute: 0 10*3/uL (ref 0.0–0.1)
Basophils Relative: 0 %
Eosinophils Absolute: 0.1 10*3/uL (ref 0.0–0.5)
Eosinophils Relative: 2 %
HCT: 33.1 % — ABNORMAL LOW (ref 36.0–46.0)
Hemoglobin: 11.1 g/dL — ABNORMAL LOW (ref 12.0–15.0)
Immature Granulocytes: 1 %
Lymphocytes Relative: 29 %
Lymphs Abs: 2.3 10*3/uL (ref 0.7–4.0)
MCH: 31 pg (ref 26.0–34.0)
MCHC: 33.5 g/dL (ref 30.0–36.0)
MCV: 92.5 fL (ref 80.0–100.0)
Monocytes Absolute: 0.7 10*3/uL (ref 0.1–1.0)
Monocytes Relative: 8 %
Neutro Abs: 4.7 10*3/uL (ref 1.7–7.7)
Neutrophils Relative %: 60 %
Platelets: 216 10*3/uL (ref 150–400)
RBC: 3.58 MIL/uL — ABNORMAL LOW (ref 3.87–5.11)
RDW: 13 % (ref 11.5–15.5)
WBC: 7.8 10*3/uL (ref 4.0–10.5)
nRBC: 0 % (ref 0.0–0.2)

## 2020-02-08 LAB — TYPE AND SCREEN
ABO/RH(D): O POS
Antibody Screen: NEGATIVE

## 2020-02-08 LAB — URIC ACID: Uric Acid, Serum: 6.5 mg/dL (ref 2.5–7.1)

## 2020-02-08 NOTE — Plan of Care (Signed)
  Problem: Education: Goal: Knowledge of the prescribed therapeutic regimen will improve Outcome: Completed/Met   Problem: Activity: Goal: Risk for activity intolerance will decrease Outcome: Completed/Met   Problem: Nutrition: Goal: Adequate nutrition will be maintained Outcome: Completed/Met   Problem: Coping: Goal: Level of anxiety will decrease Outcome: Completed/Met   Problem: Elimination: Goal: Will not experience complications related to urinary retention Outcome: Completed/Met

## 2020-02-08 NOTE — Progress Notes (Signed)
Etha Landry 34 y.o. G1P0 at [redacted]w[redacted]d HD#4 admitted with new onset preE with SF  S: Patient is doing well this morning. Denies any HA or vision changes. Denies CP or SOB. Denies RUQ/epigastric pain or N/V. Tolerating a regular diet. No changes in swelling, mild in b/l lower legs. Endorses good FM, states baby girl is very active today. No complaints of CTXs, VB, or LOF.  O: Vitals:   02/08/20 0635 02/08/20 0642 02/08/20 0845 02/08/20 1036  BP: (!) 159/93 (!) 148/91 (!) 151/96 (!) 150/94  Pulse: 82 87 90 91  Resp: 20 20 20    Temp: 98.2 F (36.8 C)  98.6 F (37 C) 98.2 F (36.8 C)  TempSrc: Oral  Oral   SpO2: 97%  98%   Weight:      Height:       CBC    Component Value Date/Time   WBC 7.8 02/08/2020 0545   RBC 3.58 (L) 02/08/2020 0545   HGB 11.1 (L) 02/08/2020 0545   HCT 33.1 (L) 02/08/2020 0545   PLT 216 02/08/2020 0545   MCV 92.5 02/08/2020 0545   MCH 31.0 02/08/2020 0545   MCHC 33.5 02/08/2020 0545   RDW 13.0 02/08/2020 0545   LYMPHSABS 2.3 02/08/2020 0545   MONOABS 0.7 02/08/2020 0545   EOSABS 0.1 02/08/2020 0545   BASOSABS 0.0 02/08/2020 0545   CMP     Component Value Date/Time   NA 136 02/08/2020 0545   K 4.0 02/08/2020 0545   CL 106 02/08/2020 0545   CO2 23 02/08/2020 0545   GLUCOSE 73 02/08/2020 0545   BUN 12 02/08/2020 0545   CREATININE 0.78 02/08/2020 0545   CALCIUM 8.2 (L) 02/08/2020 0545   PROT 5.0 (L) 02/08/2020 0545   ALBUMIN 2.1 (L) 02/08/2020 0545   AST 18 02/08/2020 0545   ALT 12 02/08/2020 0545   ALKPHOS 92 02/08/2020 0545   BILITOT 0.5 02/08/2020 0545   GFRNONAA >60 02/08/2020 0545   MFM BPP 11/4 Impression: Amniotic fluid is normal and good fetal activity seen.  Antenatal testing is reassuring.  BPP 8/8.  Umbilical artery  Doppler showed normal forward diastolic flow.  Cephalic presentation. NST: reactive baseline 140 bpm mod var + accels, no decels   Physical Exam: General: well appearing, no acute distress Heart: RRR  Lungs: CTABL no  rales no crackles Abdomen: gravid uterus, non-tender Neuro: +1 b/l LE DTR's no clonus  A/P: Zoelle Blaisdell 34 y.o. G1P0 at [redacted]w[redacted]d HD#4 admitted with preE w/ SF currently stable. She is now s/p 24hr of Mag seizure ppx (11/1-11/2). BP's currently controlled with PO Labetalol 200mg  q8hr and Procardia 30XL q12hr. No severe range pressures over past 24hrs and no IV pushes required since 11/2. PIH labs stable this morning and patient remains asymptomatic. Fetal status remains reassuring with BPP 8/8 and reactive NST. S/P MFM consultation. NICU consult pending.  1. Continue current antihypertensive regimen with close BP monitoring 2. PIH labs twice weekly 3. BPP twice weekly 4. NST q shift 5. GBS pending 6. Regular diet 7. SCD VTE ppx  8. Routine antepartum care 9. DIspo: continue inpatient antepartum mgmt until delivery, with delivery goal of 34 weeks. Plan for rescue course steroids prior to IOL (cephalic). Discussed earlier delivery if indicated by uncontrolled severe range BP's, changes in labs, or changes in fetal status.     Esme Durkin A Jaziah Goeller 02/08/20 11:38 AM

## 2020-02-09 LAB — CULTURE, BETA STREP (GROUP B ONLY)

## 2020-02-09 MED ORDER — LABETALOL HCL 100 MG PO TABS: 100.0000 mg | ORAL_TABLET | Freq: Every day | ORAL | Status: DC

## 2020-02-09 MED ORDER — LABETALOL HCL 200 MG PO TABS
300.0000 mg | ORAL_TABLET | Freq: Every day | ORAL | Status: DC
  Administered 2020-02-09 – 2020-02-10 (×2): 300 mg via ORAL
  Filled 2020-02-09 (×2): qty 1

## 2020-02-09 MED ORDER — LABETALOL HCL 200 MG PO TABS
200.0000 mg | ORAL_TABLET | Freq: Two times a day (BID) | ORAL | Status: DC
  Administered 2020-02-10 – 2020-02-11 (×3): 200 mg via ORAL
  Filled 2020-02-09 (×3): qty 1

## 2020-02-09 NOTE — Progress Notes (Signed)
Samantha [name] 34 y.o. G1P0 at 33wks, HD#5  admitted with new onset preE with severe features   S: Patient is doing well. Denies any HA or vision changes. Denies CP or SOB. Denies RUQ/epigastric pain or N/V. Tolerating a regular diet. No changes in swelling, mild in b/l lower legs. Good FM, No UCs/ VB/ LOF/ back pain  O: Vitals:   02/09/20 0549 02/09/20 0615 02/09/20 0800 02/09/20 1151  BP: (!) 153/110 (!) 155/95 (!) 149/86 (!) 150/86  Pulse: 71 83 87 80  Resp:   18 20  Temp:   98.5 F (36.9 C) 98.8 F (37.1 C)  TempSrc:   Oral Oral  SpO2:  99% 98% 98%  Weight:    113.9 kg  Height:       Weight 11/1 112.7 Kg --> 11/5  113.9 kg (+ 2.6 lbs in 5 days)  CBC CBC Latest Ref Rng & Units 02/08/2020 02/06/2020 02/05/2020  WBC 4.0 - 10.5 K/uL 7.8 9.1 9.1  Hemoglobin 12.0 - 15.0 g/dL 11.1(L) 11.7(L) 11.8(L)  Hematocrit 36 - 46 % 33.1(L) 34.5(L) 35.0(L)  Platelets 150 - 400 K/uL 216 245 254   CMP  CMP Latest Ref Rng & Units 02/08/2020 02/06/2020 02/05/2020  Glucose 70 - 99 mg/dL 73 94 76  BUN 6 - 20 mg/dL 12 8 10   Creatinine 0.44 - 1.00 mg/dL 9.92 4.26  Sodium 135 - 145 mmol/L 136 132(L) 135  Potassium 3.5 - 5.1 mmol/L 4.0 4.2 4.2  Chloride 98 - 111 mmol/L 106 102 105  CO2 22 - 32 mmol/L 23 22 24   Calcium 8.9 - 10.3 mg/dL 8.2(L) 7.4(L) 8.5(L)  Total Protein 6.5 - 8.1 g/dL 5.0(L) 5.3(L) 5.4(L)  Total Bilirubin 0.3 - 1.2 mg/dL 0.5 0.5 0.5  Alkaline Phos 38 - 126 U/L 92 98 89  AST 15 - 41 U/L 18 18 22   ALT 0 - 44 U/L 12 12 11      MFM BPP 11/4: Cephalic, nl AFI, BPP 8/8, UAD nl NST: reactive baseline 140 bpm mod var + accels, no decels per shift   Physical Exam: General: well appearing, no acute distress Heart: RRR  Lungs: CTABL no rales no crackles Abdomen: gravid uterus, non-tender Neuro: +1 b/l LE DTR's no clonus. No calf tenderness or cords   A/P: Samantha [name] 34 y.o. G1P0 at 33 wks with preE w/ severe features, currently stable.  S/p Mag seizure ppx (11/1-11/2).   BP's currently controlled with PO Labetalol 200mg  q8hr and Procardia 30XL q12hr. Few higher systolics, so adding 100mg  extra Labetalol with night dose ie. 200 mg - 200mg  - 300mg   No severe range pressures needing IV pushes since 11/2.  PIH labs stable - repeat Mon-Thurs and prn S/p MFM consult  Sp NICU consult  1. Small change in current antihypertensive regimen with close BP monitoring- Dr 13/4 informed (coming on call) 2. PIH labs twice weekly 3. BPP twice weekly 4. NST q shift 5. GBS (-) done 11/3  6. Regular diet 7. SCD VTE ppx  8. Routine antepartum care 9. DIspo: continue inpatient antepartum mgmt until delivery, with delivery goal of 34 weeks. Plan for rescue course steroids prior to IOL (cephalic). Discussed earlier delivery if indicated by uncontrolled severe range BP's, changes in labs, or changes in fetal status.   Patient counseled on IOL, possible C/s for maternal or fetal reasons, she will be prepared Needs to pick Peds, wants in [facility], possibly [facility], will d/w husband   02/09/20  2:22 PM

## 2020-02-10 MED ORDER — LABETALOL HCL 100 MG PO TABS
100.0000 mg | ORAL_TABLET | Freq: Once | ORAL | Status: AC
  Administered 2020-02-10: 100 mg via ORAL
  Filled 2020-02-10: qty 1

## 2020-02-10 NOTE — Consult Note (Signed)
Asked by Dr Amado Nash to speak to Mrs Spiker in anticipation of preterm delivery at 34 weeks. She has been in house for 5 days for severe preeclampsia. She is at 33 1/7 weeks currently and is planned to be delivered at 34 weeks. She has received a course of betamethasone end of Oct and has received mag sulfate on admission. She is on procardia and labetalol. Based on fetal US, growth of the baby was at 10%.  I spoke to Mrs Barbee Cough in her room with her husband present. I prepared them for the baby needing admission in the NICU due to prematurity. In addition, I discussed other potential problems in preterm babies such as immature lungs with possible need for resp support, hypoglycemia with possible need for IV fluids, and gavage feeding while learning to nipple feed. I discussed the benefits of breast milk and encouraged her to make the commitment to provide it for the baby. I also talked about estimated LOS.  I answered their questions fully.  Time spent for this consult is 30 min. More than 50% of the time spent was face to face with Mrs Melodye Ped and her husband.  Thank you for inviting [place] to participate in this patient's care.  Lucillie Garfinkel MD Neonatologist

## 2020-02-10 NOTE — Plan of Care (Signed)
  Problem: Education: Goal: Knowledge of disease or condition will improve Outcome: Progressing   Problem: Education: Goal: Knowledge of General Education information will improve Description: Including pain rating scale, medication(s)/side effects and non-pharmacologic comfort measures Outcome: Completed/Met   Problem: Elimination: Goal: Will not experience complications related to bowel motility Outcome: Completed/Met

## 2020-02-10 NOTE — Progress Notes (Signed)
HD6 33.1, severe pre-e  Denies h/a, vision changes, ruq pain; no ctx/vb/lof;  husband says they have peds in [place], wondering if they need peds in [place]  Patient Vitals for the past 24 hrs:  BP Temp Temp src Pulse Resp SpO2 Weight  02/10/20 0726 (!) 149/80 97.7 F (36.5 C) Axillary 85 18 98 % --  02/10/20 0346 (!) 154/93 97.8 F (36.6 C) Oral 89 19 98 % --  02/10/20 0300 -- -- -- -- -- -- 113.4 kg  02/09/20 2254 (!) 159/99 98.3 F (36.8 C) Oral 82 18 100 % --  02/09/20 1925 (!) 153/87 98.3 F (36.8 C) Oral 83 18 98 % --  02/09/20 1646 (!) 151/91 98 F (36.7 C) Oral 87 18 99 % --  02/09/20 1151 (!) 150/86 98.8 F (37.1 C) Oral 80 20 98 % 113.9 kg   A&ox3 rrr ctab Abd: soft, nt, gravid LE: +1 bilat edema, nt  FHT: 130-150 baseline, nml variability; +accels, 1 variable TOCO: none  CBC Latest Ref Rng & Units 02/08/2020 02/06/2020 02/05/2020  WBC 4.0 - 10.5 K/uL 7.8 9.1 9.1  Hemoglobin 12.0 - 15.0 g/dL 11.1(L) 11.7(L) 11.8(L)  Hematocrit 36 - 46 % 33.1(L) 34.5(L) 35.0(L)  Platelets 150 - 400 K/uL 216 245 254   CMP Latest Ref Rng & Units 02/08/2020 02/06/2020 02/05/2020  Glucose 70 - 99 mg/dL 73 94 76  BUN 6 - 20 mg/dL 12 8 10   Creatinine 0.44 - 1.00 mg/dL 0.86 5.78  Sodium 135 - 145 mmol/L 136 132(L) 135  Potassium 3.5 - 5.1 mmol/L 4.0 4.2 4.2  Chloride 98 - 111 mmol/L 106 102 105  CO2 22 - 32 mmol/L 23 22 24   Calcium 8.9 - 10.3 mg/dL 8.2(L) 7.4(L) 8.5(L)  Total Protein 6.5 - 8.1 g/dL 5.0(L) 5.3(L) 5.4(L)  Total Bilirubin 0.3 - 1.2 mg/dL 0.5 0.5 0.5  Alkaline Phos 38 - 126 U/L 92 98 89  AST 15 - 41 U/L 18 18 22   ALT 0 - 44 U/L 12 12 11    U/s 11/1: cephalic, afi 15, bpp 8/8, efw 3'12" (1710g) 10% 11/4: bpp 8/8, uad wnl, cephalic, afi wnl  A/P: iup at 33.1 wga 1. Severe pre-e: s/p magnesium sulfate - procardia 97ml xl bid; labetalol 200 am, 200mg  mid-day, 300 mg pm (just increased last night), nml labs, no severe rangs bps in last 24 hrs, contin to monitor closely,  labs 2x wkly (m/th); s/p MFM consult; plan delivery 34 wks unless worsening maternal/fetal status 2. Fetal status: reassuring; nst q shift, bpp 2x wkly; s/p nicu consult; cephalic 3. Prematurity - s/p bmz x2, plan rescue dose prior to IOL 4. gbs neg 11/3 5. vte prevention-scds 6. Plans [place] peds

## 2020-02-11 LAB — TYPE AND SCREEN
ABO/RH(D): O POS
Antibody Screen: NEGATIVE

## 2020-02-11 MED ORDER — LABETALOL HCL 200 MG PO TABS
300.0000 mg | ORAL_TABLET | Freq: Three times a day (TID) | ORAL | Status: DC
  Administered 2020-02-11 – 2020-02-19 (×26): 300 mg via ORAL
  Filled 2020-02-11 (×26): qty 1

## 2020-02-11 NOTE — Progress Notes (Signed)
HD #7 Severe pre-e  No h/a, vision changes, ruq pain; no ctx/vb/lof; +FM No c/o.  Patient Vitals for the past 24 hrs:  BP Temp Temp src Pulse Resp SpO2 Weight  02/11/20 0900 (!) 147/92 98.4 F (36.9 C) Oral 94 18 98 % --  02/11/20 0718 -- -- -- -- -- -- 117 kg  02/11/20 0205 136/87 98.6 F (37 C) Oral 94 20 98 % --  02/10/20 2314 (!) 158/90 98.3 F (36.8 C) -- 92 20 99 % --  02/10/20 1918 (!) 152/93 98.8 F (37.1 C) Oral 92 18 100 % --  02/10/20 1541 (!) 159/72 98.1 F (36.7 C) Oral 82 18 98 % --  02/10/20 1159 (!) 150/95 98.5 F (36.9 C) Oral 87 18 98 % --   A&ox3 rrr ctab Abd: soft, nt, nd; gravid LE: 1-2+ edema, nt bilat  FHT: 120s-150s baseline, normal variability; once decel last night about 2 min with accel during and continued normal variablity, no other decels, one variabile; 10x10 accels TOCO: no ctx  U/s 11/1: cephalic, afi 15, bpp 8/8, efw 3'12" (1710g) 10% 11/4: bpp 8/8, uad wnl, cephalic, afi wnl  A/P: iup at 33.1 wga 1. Severe pre-e: s/p magnesium sulfate - procardia 30ml xl bid; labetalol now 300mg  tid, nml labs, no severe rangs bps in last 24 hrs, contin to monitor closely, labs 2x wkly (m/th); s/p MFM consult; plan delivery 34 wks unless worsening maternal/fetal status 2. Fetal status: reassuring; nst q shift, bpp 2x wkly; s/p nicu consult (note in chart); cephalic 3. Prematurity - s/p bmz x2, plan rescue dose prior to IOL 4. gbs neg 11/3 5. vte prevention-scds 6. Plans [place] peds

## 2020-02-12 ENCOUNTER — Inpatient Hospital Stay (HOSPITAL_BASED_OUTPATIENT_CLINIC_OR_DEPARTMENT_OTHER): Payer: 59

## 2020-02-12 DIAGNOSIS — O365930 Maternal care for other known or suspected poor fetal growth, third trimester, not applicable or unspecified: Secondary | ICD-10-CM

## 2020-02-12 DIAGNOSIS — Z3A33 33 weeks gestation of pregnancy: Secondary | ICD-10-CM

## 2020-02-12 DIAGNOSIS — O1413 Severe pre-eclampsia, third trimester: Secondary | ICD-10-CM | POA: Diagnosis not present

## 2020-02-12 LAB — COMPREHENSIVE METABOLIC PANEL
ALT: 9 U/L (ref 0–44)
AST: 14 U/L — ABNORMAL LOW (ref 15–41)
Albumin: 1.9 g/dL — ABNORMAL LOW (ref 3.5–5.0)
Alkaline Phosphatase: 99 U/L (ref 38–126)
Anion gap: 8 (ref 5–15)
BUN: 13 mg/dL (ref 6–20)
CO2: 22 mmol/L (ref 22–32)
Calcium: 8.5 mg/dL — ABNORMAL LOW (ref 8.9–10.3)
Chloride: 106 mmol/L (ref 98–111)
Creatinine, Ser: 0.86 mg/dL (ref 0.44–1.00)
GFR, Estimated: >60 mL/min (ref >60)
Glucose, Bld: 74 mg/dL (ref 70–99)
Potassium: 4.1 mmol/L (ref 3.5–5.1)
Sodium: 136 mmol/L (ref 135–145)
Total Bilirubin: 0.7 mg/dL (ref 0.3–1.2)
Total Protein: 4.9 g/dL — ABNORMAL LOW (ref 6.5–8.1)

## 2020-02-12 LAB — CBC WITH DIFFERENTIAL/PLATELET
Abs Immature Granulocytes: 0.05 10*3/uL (ref 0.00–0.07)
Basophils Absolute: 0 10*3/uL (ref 0.0–0.1)
Basophils Relative: 0 %
Eosinophils Absolute: 0.2 10*3/uL (ref 0.0–0.5)
Eosinophils Relative: 2 %
HCT: 34.7 % — ABNORMAL LOW (ref 36.0–46.0)
Hemoglobin: 11.6 g/dL — ABNORMAL LOW (ref 12.0–15.0)
Immature Granulocytes: 1 %
Lymphocytes Relative: 26 %
Lymphs Abs: 2.4 10*3/uL (ref 0.7–4.0)
MCH: 30.4 pg (ref 26.0–34.0)
MCHC: 33.4 g/dL (ref 30.0–36.0)
MCV: 91.1 fL (ref 80.0–100.0)
Monocytes Absolute: 0.7 10*3/uL (ref 0.1–1.0)
Monocytes Relative: 7 %
Neutro Abs: 5.9 10*3/uL (ref 1.7–7.7)
Neutrophils Relative %: 64 %
Platelets: 192 10*3/uL (ref 150–400)
RBC: 3.81 MIL/uL — ABNORMAL LOW (ref 3.87–5.11)
RDW: 13.2 % (ref 11.5–15.5)
WBC: 9.2 10*3/uL (ref 4.0–10.5)
nRBC: 0.2 % (ref 0.0–0.2)

## 2020-02-12 LAB — URIC ACID: Uric Acid, Serum: 6.7 mg/dL (ref 2.5–7.1)

## 2020-02-12 NOTE — Progress Notes (Signed)
Samantha [name] 34 y.o. G1P0 at [redacted]w[redacted]d HD#8 admitted with preeclampsia with severe features  S: Patient is doing well this morning. Denies HA or vision changes. Reports some intermittent upper back pain, but attributes this to laying in bed and positioning. One episode of N/V this morning, spontaneously resolved, and attributes this to what she ate. Denies any N/V currently. No changes in LE swelling. Voiding spontaneously and ambulating to bathroom. Denies any CP or SOB. Endorses good FM. Denies any CTXs, VB, or LOF. Patient is anxious to get to IOL date on Friday, states she is tired of laying in the bed.  O: Vitals:   02/11/20 1939 02/11/20 2325 02/12/20 0336 02/12/20 0738  BP: (!) 153/96 (!) 152/92 (!) 156/95 (!) 157/95  Pulse: 84 78 87 88  Resp: 20 18 17 18   Temp: 98.2 F (36.8 C) 98.5 F (36.9 C) 98.3 F (36.8 C) 98.5 F (36.9 C)  TempSrc: Oral Oral Oral Oral  SpO2: 98% 95% 97% 97%  Weight:   115.8 kg   Height:        NST: reactive baseline 140 bpm mod var +accels, -decels Toco: acontractile  General: AAO, NAD Heart: RRR Lungs: CTABL no rales no crackles Abdomen: gravid uterus, non-tender to palpation LE: +1-2 b/l edema Neuro: +1 DTR's b/l    CBC    Component Value Date/Time   WBC 9.2 02/12/2020 0615   RBC 3.81 (L) 02/12/2020 0615   HGB 11.6 (L) 02/12/2020 0615   HCT 34.7 (L) 02/12/2020 0615   PLT 192 02/12/2020 0615   MCV 91.1 02/12/2020 0615   MCH 30.4 02/12/2020 0615   MCHC 33.4 02/12/2020 0615   RDW 13.2 02/12/2020 0615   LYMPHSABS 2.4 02/12/2020 0615   MONOABS 0.7 02/12/2020 0615   EOSABS 0.2 02/12/2020 0615   BASOSABS 0.0 02/12/2020 0615   CMP     Component Value Date/Time   NA 136 02/12/2020 0615   K 4.1 02/12/2020 0615   CL 106 02/12/2020 0615   CO2 22 02/12/2020 0615   GLUCOSE 74 02/12/2020 0615   BUN 13 02/12/2020 0615   CREATININE 0.86 02/12/2020 0615   CALCIUM 8.5 (L) 02/12/2020 0615   PROT 4.9 (L) 02/12/2020 0615   ALBUMIN 1.9 (L)  02/12/2020 0615   AST 14 (L) 02/12/2020 0615   ALT 9 02/12/2020 0615   ALKPHOS 99 02/12/2020 0615   BILITOT 0.7 02/12/2020 0615   GFRNONAA >60 02/12/2020 0615   A/P:  [name] 34 y.o. G1P0 at [redacted]w[redacted]d HD#8 admitted with severe preeclampsia, currently stable. She is now s/p 24hr of Mag seizure ppx (11/1-11/2). BP's currently controlled with PO Labetalol 300mg  q8hr and Procardia 30XL q12hr. No severe range pressures over past 24hrs and no IV pushes required since 11/5. PIH labs stable this morning and patient remains asymptomatic. Fetal status remains reassuring with reactive NST. S/P MFM consultation.   1. Continue current antihypertensive regimen with close BP monitoring 2. PIH labs twice weekly (M/Th) 3. BPP twice weekly: ordered for today, scan pending (M/Th) 4. NST q shift 5. GBS NEG 6. Regular diet 7. SCD VTE ppx  8. Routine antepartum care 9. DIspo: continue inpatient antepartum mgmt until delivery, with delivery goal of 34 weeks. IOL scheduled for 11/12. Plan for rescue course steroids prior to IOL (cephalic). Discussed earlier delivery if indicated by uncontrolled severe range BP's, changes in labs, or changes in fetal status.     Samantha [name] A Samantha [name] 02/12/20 10:47 AM

## 2020-02-13 MED ORDER — BETAMETHASONE SOD PHOS & ACET 6 (3-3) MG/ML IJ SUSP
12.0000 mg | INTRAMUSCULAR | Status: AC
  Administered 2020-02-14 – 2020-02-15 (×2): 12 mg via INTRAMUSCULAR
  Filled 2020-02-13: qty 5

## 2020-02-13 NOTE — Progress Notes (Signed)
HD 9 PEC with severe features 33w 4d  S: No complaints. Good FM, No contractions, bleeding or LOF. Denies HA , CP or SOB. No visual changes. Occ nausea.   O: BP (!) 148/84 (BP Location: Left Arm)   Pulse 84   Temp 98.4 F (36.9 C) (Oral)   Resp 18   Ht 5\' 7"  (1.702 m)   Wt 116.1 kg   SpO2 99%   BMI 40.10 kg/m   NCAT HEENT : nl Neck: supple with FROM Lungs: CTA, NO WRR CV: RRR ABD: Gravid, NT, No RUQ tenderness No CVAT EXT: 2+ pretibial edema, DTRs 2+ Neuro: nonfocal Skin: intact  CBC    Component Value Date/Time   WBC 9.2 02/12/2020 0615   RBC 3.81 (L) 02/12/2020 0615   HGB 11.6 (L) 02/12/2020 0615   HCT 34.7 (L) 02/12/2020 0615   PLT 192 02/12/2020 0615   MCV 91.1 02/12/2020 0615   MCH 30.4 02/12/2020 0615   MCHC 33.4 02/12/2020 0615   RDW 13.2 02/12/2020 0615   LYMPHSABS 2.4 02/12/2020 0615   MONOABS 0.7 02/12/2020 0615   EOSABS 0.2 02/12/2020 0615   BASOSABS 0.0 02/12/2020 0615   CMP     Component Value Date/Time   NA 136 02/12/2020 0615   K 4.1 02/12/2020 0615   CL 106 02/12/2020 0615   CO2 22 02/12/2020 0615   GLUCOSE 74 02/12/2020 0615   BUN 13 02/12/2020 0615   CREATININE 0.86 02/12/2020 0615   CALCIUM 8.5 (L) 02/12/2020 0615   PROT 4.9 (L) 02/12/2020 0615   ALBUMIN 1.9 (L) 02/12/2020 0615   AST 14 (L) 02/12/2020 0615   ALT 9 02/12/2020 0615   ALKPHOS 99 02/12/2020 0615   BILITOT 0.7 02/12/2020 0615   GFRNONAA >60 02/12/2020 0615    Sono c/w EFW 10th percentile, nl AFI on admission sono  BPP 8/8 11/9 with nl UAD FHR 140-150 , BTBV 5-25, accels 15x 15, category 1  IMP: 33w 4d IUP PEC with severe features (BP criteria). Nl labs. Reassuring fetal surveillance. BMZ complete.   P: Continue expectant management Labetalol 300mg  tid Nifedipine 30mg  XL bid  Rescue dose BMZ tomorrow Rpt labs twice per week NICU consult EFM /TOCO tid Rpt BPP Thursday and continue twice weekly Remain inpt until delivery Delivery goal 34 wks

## 2020-02-13 NOTE — Progress Notes (Signed)
Initial Nutrition Assessment  DOCUMENTATION CODES:  Obesity unspecified  INTERVENTION:  Regular diet Pt may order double protein portions and snacks TID if she makes request when ordering meals   NUTRITION DIAGNOSIS:  Increased nutrient needs related to  (pregancy and fetal growth requirements) as evidenced by  (33 weeks IUP).  GOAL:   Patient will meet greater than or equal to 90% of their needs   MONITOR:  Weight trends  REASON FOR ASSESSMENT:  LOS, Antenatal   ASSESSMENT:  33 4/7 weeks, adm w/ severe PEC. IOL planned for 11/12. Wt at 10 weeks 93.2 kg, BMI 32.1   50 lb weight gain to date   Diet Order:   Diet Order            Diet regular Room service appropriate? Yes; Fluid consistency: Thin  Diet effective now                 EDUCATION NEEDS:   No education needs have been identified at this time  Skin:  Skin Assessment: Reviewed RN Assessment   Height:   Ht Readings from Last 1 Encounters:  02/05/20 5\' 7"  (1.702 m)    Weight:   Wt Readings from Last 1 Encounters:  02/13/20 116.1 kg    Ideal Body Weight:    135 lbs BMI:  Body mass index is 40.1 kg/m. pre-preg BMI 32  Estimated Nutritional Needs:   Kcal:  2300-2500  Protein:  95-105 g  Fluid:  2.6L    13/09/21 M.Elisabeth Cara LDN Neonatal Nutrition Support Specialist/RD III

## 2020-02-14 NOTE — Progress Notes (Signed)
Patient ID: Samantha [name], female   DOB: 1985/08/01, 34 y.o.   MRN: [id number] RN called to assess FHT around 20.45 pm time Reviewed, spoke with RN, no concerns, no decel, change in baseline and variability moderate and back to 130-140s  Cat I/ reactive

## 2020-02-14 NOTE — Progress Notes (Addendum)
Hospital day #9, severe preeclampsia 33 weeks 5 days  Subjective: Patient reports no headache, no vision change, no scotomata, no right upper quadrant pain, no chest pain, no shortness of breath.  Patient reports no contractions, vaginal bleeding, leakage of fluid.  Patient notes active fetal movement.  Patient is excited about plan for delivery on Friday  Objective: Vitals with BMI 02/14/2020 02/14/2020 02/13/2020  Height - - -  Weight - 258 lbs -  BMI - 40.4 -  Systolic 147 157 496  Diastolic 100 97 72  Pulse 81 85 80   General: Obese, no distress Cardiovascular: Regular rate and rhythm Pulmonary: Clear to auscultation bilaterally Abdomen: Obese, gravid, no fundal tenderness, no right upper quadrant pain GU: Deferred Lower extremity: Trace edema, 1+ DTR  Toco: No contractions FH: 120s, positive celebrations, no decelerations, 10 beat variability  CBC Latest Ref Rng & Units 02/12/2020 02/08/2020 02/06/2020  WBC 4.0 - 10.5 K/uL 9.2 7.8 9.1  Hemoglobin 12.0 - 15.0 g/dL 11.6(L) 11.1(L) 11.7(L)  Hematocrit 36 - 46 % 34.7(L) 33.1(L) 34.5(L)  Platelets 150 - 400 K/uL 192 216 245   CMP Latest Ref Rng & Units 02/12/2020 02/08/2020 02/06/2020  Glucose 70 - 99 mg/dL 74 73 94  BUN 6 - 20 mg/dL 13 12 8   Creatinine 0.44 - 1.00 mg/dL 7.59 1.63  Sodium 135 - 145 mmol/L 136 136 132(L)  Potassium 3.5 - 5.1 mmol/L 4.1 4.0 4.2  Chloride 98 - 111 mmol/L 106 106 102  CO2 22 - 32 mmol/L 22 23 22   Calcium 8.9 - 10.3 mg/dL 8.46) ) 7.4(L)  Total Protein 6.5 - 8.1 g/dL 4.9(L) 5.0(L) 5.3(L)  Total Bilirubin 0.3 - 1.2 mg/dL 0.7 0.5 0.5  Alkaline Phos 38 - 126 U/L 99 92 98  AST 15 - 41 U/L 14(L) 18 18  ALT 0 - 44 U/L 9 12 12    Assessment and plan: 34 year old at 33 weeks and 5 days with preeclampsia with severe features by blood pressure, currently asymptomatic and with stable labs. -Preeclampsia.  Patient has maintained nonsevere range blood pressures on labetalol 300 mg every 8 hours and  nifedipine 30 mg XL twice daily.  Labs and symptoms have been stable.  Plan labs twice weekly, due tomorrow. given diagnosis of severe preeclampsia plan is for delivery at 34 weeks.  Given vertex presentation patient elects for trial of labor.  Will likely need cervical ripening.  Patient is aware this may be a prolonged process. -Fetal testing.  Reassuring NST today.  Continue BPP twice weekly.  Due tomorrow.  Rescue course of betamethasone started this a.m.  Repeat betamethasone tomorrow -GBS today -Inpatient until delivery  9.3(T 02/14/2020 10:21 AM    GBS culture -1-week ago.  Will not repeat today  34 02/14/2020 10:22 AM

## 2020-02-15 ENCOUNTER — Inpatient Hospital Stay (HOSPITAL_BASED_OUTPATIENT_CLINIC_OR_DEPARTMENT_OTHER): Payer: 59

## 2020-02-15 DIAGNOSIS — Z362 Encounter for other antenatal screening follow-up: Secondary | ICD-10-CM | POA: Diagnosis not present

## 2020-02-15 DIAGNOSIS — Z3A33 33 weeks gestation of pregnancy: Secondary | ICD-10-CM | POA: Diagnosis not present

## 2020-02-15 DIAGNOSIS — O365930 Maternal care for other known or suspected poor fetal growth, third trimester, not applicable or unspecified: Secondary | ICD-10-CM | POA: Diagnosis not present

## 2020-02-15 DIAGNOSIS — O1413 Severe pre-eclampsia, third trimester: Secondary | ICD-10-CM

## 2020-02-15 LAB — CBC WITH DIFFERENTIAL/PLATELET
Abs Immature Granulocytes: 0.1 10*3/uL — ABNORMAL HIGH (ref 0.00–0.07)
Basophils Absolute: 0 10*3/uL (ref 0.0–0.1)
Basophils Relative: 0 %
Eosinophils Absolute: 0 10*3/uL (ref 0.0–0.5)
Eosinophils Relative: 0 %
HCT: 32.5 % — ABNORMAL LOW (ref 36.0–46.0)
Hemoglobin: 11.3 g/dL — ABNORMAL LOW (ref 12.0–15.0)
Immature Granulocytes: 1 %
Lymphocytes Relative: 18 %
Lymphs Abs: 2.2 10*3/uL (ref 0.7–4.0)
MCH: 31.1 pg (ref 26.0–34.0)
MCHC: 34.8 g/dL (ref 30.0–36.0)
MCV: 89.5 fL (ref 80.0–100.0)
Monocytes Absolute: 0.7 10*3/uL (ref 0.1–1.0)
Monocytes Relative: 6 %
Neutro Abs: 8.8 10*3/uL — ABNORMAL HIGH (ref 1.7–7.7)
Neutrophils Relative %: 75 %
Platelets: 171 10*3/uL (ref 150–400)
RBC: 3.63 MIL/uL — ABNORMAL LOW (ref 3.87–5.11)
RDW: 13.2 % (ref 11.5–15.5)
WBC: 11.8 10*3/uL — ABNORMAL HIGH (ref 4.0–10.5)
nRBC: 0 % (ref 0.0–0.2)

## 2020-02-15 LAB — COMPREHENSIVE METABOLIC PANEL
ALT: 12 U/L (ref 0–44)
AST: 21 U/L (ref 15–41)
Albumin: 1.9 g/dL — ABNORMAL LOW (ref 3.5–5.0)
Alkaline Phosphatase: 116 U/L (ref 38–126)
Anion gap: 7 (ref 5–15)
BUN: 15 mg/dL (ref 6–20)
CO2: 21 mmol/L — ABNORMAL LOW (ref 22–32)
Calcium: 8.3 mg/dL — ABNORMAL LOW (ref 8.9–10.3)
Chloride: 109 mmol/L (ref 98–111)
Creatinine, Ser: 0.95 mg/dL (ref 0.44–1.00)
GFR, Estimated: >60 mL/min (ref >60)
Glucose, Bld: 111 mg/dL — ABNORMAL HIGH (ref 70–99)
Potassium: 4 mmol/L (ref 3.5–5.1)
Sodium: 137 mmol/L (ref 135–145)
Total Bilirubin: 0.6 mg/dL (ref 0.3–1.2)
Total Protein: 4.9 g/dL — ABNORMAL LOW (ref 6.5–8.1)

## 2020-02-15 LAB — URIC ACID: Uric Acid, Serum: 6.9 mg/dL (ref 2.5–7.1)

## 2020-02-15 NOTE — Progress Notes (Signed)
Hospital day #10, severe preeclampsia 32 weeks 6 days  Subjective: Patient reports no headache, no vision change, no scotomata, no right upper quadrant pain, no chest pain, no shortness of breath.  Patient reports no contractions, vaginal bleeding, leakage of fluid.   Patient notes active fetal movement.  Patient is excited about plan for delivery tomorrow  Objective: BP (!) 153/86 (BP Location: Left Arm)   Pulse 86   Temp 98.3 F (36.8 C) (Oral)   Resp 18   Ht 5\' 7"  (1.702 m)   Wt 118.3 kg   SpO2 99%   BMI 40.85 kg/m   Vitals with BMI 02/15/2020 02/15/2020 02/15/2020  Height - - -  Weight - 260 lbs 13 oz -  BMI - 40.84 -  Systolic 153 - 139  Diastolic 86 - 78  Pulse 86 - 88   General: Obese, no distress Cardiovascular: Regular rate and rhythm Pulmonary: Clear to auscultation bilaterally Abdomen: Obese, gravid, no fundal tenderness, no right upper quadrant pain GU: Cervix soft, closed, mid-position, Vx at -4. Pelvis borderline Lower extremity: Trace edema, 1+ DTR  Toco: No contractions FH: 140s, positive accels, no decels, moderate 15 beat variability- reactive NST q shift   CBC Latest Ref Rng & Units 02/15/2020 02/12/2020 02/08/2020  WBC 4.0 - 10.5 K/uL 11.8(H) 9.2 7.8  Hemoglobin 12.0 - 15.0 g/dL 11.3(L) 11.6(L) 11.1(L)  Hematocrit 36 - 46 % 32.5(L) 34.7(L) 33.1(L)  Platelets 150 - 400 K/uL 171 192 216   CMP Latest Ref Rng & Units 02/15/2020 02/12/2020 02/08/2020  Glucose 70 - 99 mg/dL 13/07/2019) 74 73  BUN 6 - 20 mg/dL 15 13 12   Creatinine 0.44 - 1.00 mg/dL 932(I 7.12  Sodium 135 - 145 mmol/L 137 136 136  Potassium 3.5 - 5.1 mmol/L 4.0 4.1 4.0  Chloride 98 - 111 mmol/L 109 106 106  CO2 22 - 32 mmol/L 21(L) 22 23  Calcium 8.9 - 10.3 mg/dL 8.3(L) 8.5(L) 8.2(L)  Total Protein 6.5 - 8.1 g/dL 4.9(L) 4.9(L) 5.0(L)  Total Bilirubin 0.3 - 1.2 mg/dL 0.6 0.7 0.5  Alkaline Phos 38 - 126 U/L 116 99 92  AST 15 - 41 U/L 21 14(L) 18  ALT 0 - 44 U/L 12 9 12    Assessment and  plan: 34 yo, 33.6 wks, here for Preeclampsia with severe features by blood pressure, but no neural symptoms and normal labs, baby with IUGR.  -Preeclampsia.  Patient has maintained nonsevere range blood pressures on Labetalol 300 mg q8 hours and Nifedipine 30 mg XL BID.  Labs and symptoms have been stable.  Labs twice weekly. Given diagnosis of severe preeclampsia plan is for delivery at 34 weeks ie 02/16/20 at 12.01 AM with Cytotec, given vertex presentation patient elects for trial of labor.  D/w pt long IOL, borderline pelvis, IUGR baby all increase C-section risk and she is prepared if needed. Posted for IOL in L&D   -Fetal wellbeing- Growth 11/1 IUGR 3'12" at 10%, AC 23%, UAD nl, AFI nl, Vx.                              Reassuring q shift NSTs, BPP/UAD twice weekly- 8/8, nl UAD                             S/p rescue course of betamethasone 11/10, 11/11.  -GBS Negative at this admission  -Inpatient until delivery  Robley Fries 02/15/2020 9:05 AM

## 2020-02-16 ENCOUNTER — Other Ambulatory Visit: Payer: Self-pay

## 2020-02-16 ENCOUNTER — Inpatient Hospital Stay (HOSPITAL_COMMUNITY): Payer: 59 | Admitting: Anesthesiology

## 2020-02-16 ENCOUNTER — Encounter (HOSPITAL_COMMUNITY): Payer: Self-pay | Admitting: Obstetrics and Gynecology

## 2020-02-16 ENCOUNTER — Inpatient Hospital Stay (HOSPITAL_COMMUNITY): Payer: 59

## 2020-02-16 LAB — COMPREHENSIVE METABOLIC PANEL
ALT: 14 U/L (ref 0–44)
AST: 21 U/L (ref 15–41)
Albumin: 2 g/dL — ABNORMAL LOW (ref 3.5–5.0)
Alkaline Phosphatase: 118 U/L (ref 38–126)
Anion gap: 10 (ref 5–15)
BUN: 19 mg/dL (ref 6–20)
CO2: 20 mmol/L — ABNORMAL LOW (ref 22–32)
Calcium: 8.5 mg/dL — ABNORMAL LOW (ref 8.9–10.3)
Chloride: 105 mmol/L (ref 98–111)
Creatinine, Ser: 0.94 mg/dL (ref 0.44–1.00)
GFR, Estimated: >60 mL/min (ref >60)
Glucose, Bld: 103 mg/dL — ABNORMAL HIGH (ref 70–99)
Potassium: 3.9 mmol/L (ref 3.5–5.1)
Sodium: 135 mmol/L (ref 135–145)
Total Bilirubin: 0.5 mg/dL (ref 0.3–1.2)
Total Protein: 5.2 g/dL — ABNORMAL LOW (ref 6.5–8.1)

## 2020-02-16 LAB — CBC
HCT: 34 % — ABNORMAL LOW (ref 36.0–46.0)
HCT: 35.2 % — ABNORMAL LOW (ref 36.0–46.0)
Hemoglobin: 11.6 g/dL — ABNORMAL LOW (ref 12.0–15.0)
Hemoglobin: 12.1 g/dL (ref 12.0–15.0)
MCH: 30.8 pg (ref 26.0–34.0)
MCH: 30.8 pg (ref 26.0–34.0)
MCHC: 34.1 g/dL (ref 30.0–36.0)
MCHC: 34.4 g/dL (ref 30.0–36.0)
MCV: 90.2 fL (ref 80.0–100.0)
MCV: 89.6 fL (ref 80.0–100.0)
Platelets: 190 10*3/uL (ref 150–400)
Platelets: 184 10*3/uL (ref 150–400)
RBC: 3.77 MIL/uL — ABNORMAL LOW (ref 3.87–5.11)
RBC: 3.93 MIL/uL (ref 3.87–5.11)
RDW: 13.2 % (ref 11.5–15.5)
RDW: 13.1 % (ref 11.5–15.5)
WBC: 12.6 10*3/uL — ABNORMAL HIGH (ref 4.0–10.5)
WBC: 11.8 10*3/uL — ABNORMAL HIGH (ref 4.0–10.5)
nRBC: 0.3 % — ABNORMAL HIGH (ref 0.0–0.2)
nRBC: 0.3 % — ABNORMAL HIGH (ref 0.0–0.2)

## 2020-02-16 LAB — TYPE AND SCREEN
ABO/RH(D): O POS
ABO/RH(D): O POS
Antibody Screen: NEGATIVE
Antibody Screen: NEGATIVE

## 2020-02-16 LAB — MAGNESIUM: Magnesium: 5.3 mg/dL — ABNORMAL HIGH (ref 1.7–2.4)

## 2020-02-16 LAB — RPR: RPR Ser Ql: NONREACTIVE

## 2020-02-16 MED ORDER — MISOPROSTOL 25 MCG QUARTER TABLET
25.0000 ug | ORAL_TABLET | ORAL | Status: DC | PRN
  Administered 2020-02-16 (×2): 25 ug via VAGINAL
  Filled 2020-02-16 (×2): qty 1

## 2020-02-16 MED ORDER — OXYTOCIN-SODIUM CHLORIDE 30-0.9 UT/500ML-% IV SOLN
2.5000 [IU]/h | INTRAVENOUS | Status: DC
  Filled 2020-02-16 (×2): qty 500

## 2020-02-16 MED ORDER — PHENYLEPHRINE 40 MCG/ML (10ML) SYRINGE FOR IV PUSH (FOR BLOOD PRESSURE SUPPORT): 80.0000 ug | PREFILLED_SYRINGE | INTRAVENOUS | Status: DC | PRN

## 2020-02-16 MED ORDER — SOD CITRATE-CITRIC ACID 500-334 MG/5ML PO SOLN: 30.0000 mL | ORAL | Status: DC | PRN

## 2020-02-16 MED ORDER — SODIUM CHLORIDE (PF) 0.9 % IJ SOLN
INTRAMUSCULAR | Status: DC | PRN
  Administered 2020-02-16: 12 mL/h via EPIDURAL

## 2020-02-16 MED ORDER — MAGNESIUM SULFATE BOLUS VIA INFUSION
4.0000 g | Freq: Once | INTRAVENOUS | Status: AC
  Administered 2020-02-16: 4 g via INTRAVENOUS
  Filled 2020-02-16: qty 1000

## 2020-02-16 MED ORDER — OXYTOCIN BOLUS FROM INFUSION
333.0000 mL | Freq: Once | INTRAVENOUS | Status: AC
  Administered 2020-02-17: 333 mL via INTRAVENOUS

## 2020-02-16 MED ORDER — OXYCODONE-ACETAMINOPHEN 5-325 MG PO TABS: 2.0000 | ORAL_TABLET | ORAL | Status: DC | PRN

## 2020-02-16 MED ORDER — OXYTOCIN-SODIUM CHLORIDE 30-0.9 UT/500ML-% IV SOLN
1.0000 m[IU]/min | INTRAVENOUS | Status: DC
  Administered 2020-02-16: 2 m[IU]/min via INTRAVENOUS

## 2020-02-16 MED ORDER — EPHEDRINE 5 MG/ML INJ: 10.0000 mg | INTRAVENOUS | Status: DC | PRN

## 2020-02-16 MED ORDER — ACETAMINOPHEN 325 MG PO TABS: 650.0000 mg | ORAL_TABLET | ORAL | Status: DC | PRN

## 2020-02-16 MED ORDER — DIPHENHYDRAMINE HCL 50 MG/ML IJ SOLN: 12.5000 mg | INTRAMUSCULAR | Status: DC | PRN

## 2020-02-16 MED ORDER — MAGNESIUM SULFATE 40 GM/1000ML IV SOLN
2.0000 g/h | INTRAVENOUS | Status: DC
  Administered 2020-02-16: 2 g/h via INTRAVENOUS
  Filled 2020-02-16 (×3): qty 1000

## 2020-02-16 MED ORDER — LACTATED RINGERS IV SOLN: 500.0000 mL | INTRAVENOUS | Status: DC | PRN

## 2020-02-16 MED ORDER — OXYCODONE-ACETAMINOPHEN 5-325 MG PO TABS: 1.0000 | ORAL_TABLET | ORAL | Status: DC | PRN

## 2020-02-16 MED ORDER — TERBUTALINE SULFATE 1 MG/ML IJ SOLN: 0.2500 mg | Freq: Once | INTRAMUSCULAR | Status: DC | PRN

## 2020-02-16 MED ORDER — FENTANYL-BUPIVACAINE-NACL 0.5-0.125-0.9 MG/250ML-% EP SOLN
12.0000 mL/h | EPIDURAL | Status: DC | PRN
  Filled 2020-02-16: qty 250

## 2020-02-16 MED ORDER — LACTATED RINGERS IV SOLN: 500.0000 mL | Freq: Once | INTRAVENOUS | Status: DC

## 2020-02-16 MED ORDER — LACTATED RINGERS IV SOLN
INTRAVENOUS | Status: DC
  Administered 2020-02-16: 125 mL via INTRAVENOUS

## 2020-02-16 MED ORDER — LIDOCAINE HCL (PF) 1 % IJ SOLN: 30.0000 mL | INTRAMUSCULAR | Status: DC | PRN

## 2020-02-16 MED ORDER — ONDANSETRON HCL 4 MG/2ML IJ SOLN
4.0000 mg | Freq: Four times a day (QID) | INTRAMUSCULAR | Status: DC | PRN
  Administered 2020-02-16: 4 mg via INTRAVENOUS
  Filled 2020-02-16: qty 2

## 2020-02-16 MED ORDER — LIDOCAINE HCL (PF) 1 % IJ SOLN
INTRAMUSCULAR | Status: DC | PRN
  Administered 2020-02-16: 8 mL via EPIDURAL

## 2020-02-16 NOTE — Progress Notes (Signed)
S: Feeling some contractions. Sleeping in between. Husband at the bedside.   O: Vitals:   02/16/20 0801 02/16/20 0817 02/16/20 0827 02/16/20 0901  BP: (!) 163/92 (!) 169/105 (!) 150/94 (!) 157/88  Pulse: 87 89 92 91  Resp: 18 17 18 19   Temp:      TempSrc:      SpO2:      Weight:      Height:       FHT:  FHR: 135 bpm, variability: minimal ,  accelerations:  Absent,  decelerations:  Absent UC:   occasional SVE:   Dilation: 2.5 Effacement (%): 50 Station: -3 Exam by:: [email or web address], CNM  IUPC placed without difficulty.   A / P: Induction of labor due to preeclampsia, s/p vaginal Cytotec x 2  Fetal Wellbeing:  Category I Pain Control:  Labor support without medications  Preeclampsia: On magnesium sulfate, occasional severe range BP that has not required antihypertensives, no PEC symptoms, labs stable Anticipated MOD:  Guarded   Will start Pitocin 2x2 until adequate MVUs. Patient will get epidural now.   Dr. Dorisann Frames updated on patient status and plan of care.   Amado Nash, CNM, MSN 02/16/2020, 10:05 AM

## 2020-02-16 NOTE — Progress Notes (Signed)
Samantha [name] is a 34 y.o. G1P0 at [redacted]w[redacted]d by LMP admitted for induction of labor due to Pre-eclamptic toxemia of pregnancy..  Subjective: comfortable  Objective: BP (!) 143/80   Pulse 82   Temp 98.6 F (37 C) (Oral)   Resp 18   Ht 5\' 7"  (1.702 m)   Wt 118.3 kg   SpO2 97%   BMI 40.85 kg/m  I/O last 3 completed shifts: In: 224.8 [I.V.:224.8] Out: 1400 [Urine:1400] Total I/O In: 2392.6 [P.O.:300; I.V.:2092.6] Out: 1450 [Urine:1450]  FHT:  FHR: 140 bpm, variability: moderate,  accelerations:  Present,  decelerations:  Absent UC:   irregular, every 2-4 minutes SVE:   Dilation: 3.5 Effacement (%): 80 Station: -2 Exam by:: [email or web address], RN  Labs: Lab Results  Component Value Date   WBC 11.8 (H) 02/16/2020   HGB 12.1 02/16/2020   HCT 35.2 (L) 02/16/2020   MCV 89.6 02/16/2020   PLT 184 02/16/2020    Assessment / Plan: Induction of labor due to preeclampsia,  progressing well on pitocin  Labor: Progressing normally Preeclampsia:  on magnesium sulfate, no signs or symptoms of toxicity, intake and ouput balanced and labs stable Fetal Wellbeing:  Category I and Category II Pain Control:  Epidural I/D:  n/a Anticipated MOD:  guarded  [name] 02/16/2020, 5:45 PM

## 2020-02-16 NOTE — Anesthesia Procedure Notes (Signed)
Epidural Patient location during procedure: OB Start time: 02/16/2020 10:55 AM End time: 02/16/2020 11:04 AM  Staffing Anesthesiologist: Atilano Median, DO Performed: anesthesiologist   Preanesthetic Checklist Completed: patient identified, IV checked, site marked, risks and benefits discussed, surgical consent, monitors and equipment checked, pre-op evaluation and timeout performed  Epidural Patient position: sitting Prep: ChloraPrep Patient monitoring: heart rate, continuous pulse ox and blood pressure Approach: midline Location: L4-L5 Injection technique: LOR saline  Needle:  Needle type: Tuohy  Needle gauge: 17 G Needle length: 9 cm Needle insertion depth: 8 cm Catheter type: closed end flexible Catheter size: 20 Guage Catheter at skin depth: 12 cm Test dose: negative and 1.5% lidocaine  Assessment Events: blood not aspirated, injection not painful, no injection resistance and no paresthesia  Additional Notes Patient identified. Risks/Benefits/Options discussed with patient including but not limited to bleeding, infection, nerve damage, paralysis, failed block, incomplete pain control, headache, blood pressure changes, nausea, vomiting, reactions to medications, itching and postpartum back pain. Confirmed with bedside nurse the patient's most recent platelet count. Confirmed with patient that they are not currently taking any anticoagulation, have any bleeding history or any family history of bleeding disorders. Patient expressed understanding and wished to proceed. All questions were answered. Sterile technique was used throughout the entire procedure. Please see nursing notes for vital signs. Test dose was given through epidural catheter and negative prior to continuing to dose epidural or start infusion. Warning signs of high block given to the patient including shortness of breath, tingling/numbness in hands, complete motor block, or any concerning symptoms with  instructions to call for help. Patient was given instructions on fall risk and not to get out of bed. All questions and concerns addressed with instructions to call with any issues or inadequate analgesia.    Reason for block:procedure for pain

## 2020-02-16 NOTE — Anesthesia Preprocedure Evaluation (Addendum)
Anesthesia Evaluation  Patient identified by MRN, date of birth, ID band Patient awake    Reviewed: Patient's Chart, lab work & pertinent test results  Airway Mallampati: II  TM Distance: >3 FB Neck ROM: Full    Dental  (+) Teeth Intact   Pulmonary neg pulmonary ROS,    Pulmonary exam normal        Cardiovascular hypertension, Pt. on medications  Rhythm:Regular Rate:Normal     Neuro/Psych negative neurological ROS  negative psych ROS   GI/Hepatic negative GI ROS, Neg liver ROS,   Endo/Other  negative endocrine ROS  Renal/GU negative Renal ROS  negative genitourinary   Musculoskeletal negative musculoskeletal ROS (+)   Abdominal (+)  Abdomen: soft. Bowel sounds: normal.  Peds  Hematology negative hematology ROS (+)   Anesthesia Other Findings   Reproductive/Obstetrics (+) Pregnancy Pre-E on Mg, Labetalol                             Anesthesia Physical Anesthesia Plan  ASA: II  Anesthesia Plan: Epidural   Post-op Pain Management:    Induction:   PONV Risk Score and Plan: 2  Airway Management Planned:   Additional Equipment: None  Intra-op Plan:   Post-operative Plan:   Informed Consent: I have reviewed the patients History and Physical, chart, labs and discussed the procedure including the risks, benefits and alternatives for the proposed anesthesia with the patient or authorized representative who has indicated his/her understanding and acceptance.     Dental advisory given  Plan Discussed with:   Anesthesia Plan Comments: (Lab Results      Component                Value               Date                      WBC                      11.8 (H)            02/16/2020                HGB                      12.1                02/16/2020                HCT                      35.2 (L)            02/16/2020                MCV                      89.6                 02/16/2020                PLT                      184                 02/16/2020          )  Anesthesia Quick Evaluation  

## 2020-02-16 NOTE — Progress Notes (Signed)
Samantha [name] is a 34 y.o. G1P0 at [redacted]w[redacted]d by LMP admitted for induction of labor due to Pre-eclamptic toxemia of pregnancy..  Subjective: sleeping  Objective: BP (!) 147/89   Pulse 89   Temp 98.7 F (37.1 C) (Oral)   Resp 18   Ht 5\' 7"  (1.702 m)   Wt 118.3 kg   SpO2 99%   BMI 40.85 kg/m  I/O last 3 completed shifts: In: 224.8 [I.V.:224.8] Out: 1400 [Urine:1400] No intake/output data recorded.  FHT:  FHR: 140s bpm, variability: moderate,  accelerations:  Present,  decelerations:  Absent UC:   irregular, every 5 minutes SVE:   Dilation: 2 Effacement (%): 50 Station: -3 Exam by:: Dr. [email or web address] AROM- clear  Labs: Lab Results  Component Value Date   WBC 12.6 (H) 02/16/2020   HGB 11.6 (L) 02/16/2020   HCT 34.0 (L) 02/16/2020   MCV 90.2 02/16/2020   PLT 190 02/16/2020    Assessment / Plan: IOL for severe PEC on Mag  IUGR  Labor: Progressing normally Preeclampsia:  on magnesium sulfate, no signs or symptoms of toxicity, intake and ouput balanced and labs stable Fetal Wellbeing:  Category I Pain Control:  Labor support without medications I/D:  n/a Anticipated MOD:  guarded  Samantha [name] J 02/16/2020, 8:22 AM

## 2020-02-16 NOTE — Progress Notes (Signed)
Patient resting, comfortable No h/a, vision changes, ruq pain; no c/o.  Patient Vitals for the past 24 hrs:  BP Temp Temp src Pulse Resp SpO2 Weight  02/16/20 0235 -- -- -- -- -- 96 % --  02/16/20 0231 133/71 -- -- 85 18 -- --  02/16/20 0230 -- -- -- -- -- 97 % --  02/16/20 0225 -- -- -- -- -- 96 % --  02/16/20 0221 131/74 -- -- 90 18 -- --  02/16/20 0220 -- -- -- -- -- 98 % --  02/16/20 0217 134/73 -- -- 89 18 -- --  02/16/20 0215 -- -- -- -- -- 99 % --  02/16/20 0210 (!) 141/80 -- -- 85 18 -- --  02/16/20 0206 -- 97.7 F (36.5 C) Oral -- 18 -- --  02/16/20 0147 (!) 157/94 -- -- 88 18 -- --  02/16/20 0134 (!) 155/96 -- -- 86 18 -- --  02/16/20 0012 (!) 152/90 97.7 F (36.5 C) Oral 85 (!) 22 100 % --  02/15/20 1914 (!) 144/89 98.3 F (36.8 C) Oral 94 19 97 % --  02/15/20 1603 (!) 151/88 98.3 F (36.8 C) Oral 87 20 95 % --  02/15/20 1207 (!) 154/90 98.3 F (36.8 C) Oral 84 18 -- --  02/15/20 0724 (!) 153/86 98.3 F (36.8 C) Oral 86 18 -- --  02/15/20 0629 -- -- -- -- -- -- 118.3 kg  02/15/20 0518 139/78 98.3 F (36.8 C) Oral 88 18 99 % --   A&ox3 nml respirations Abd: soft, nt, nd; gravid Cx: deferred; nurse exam 0.5/40/-3, cephalic LE: +1-+2 edema, nt bilat  FHT: 120, nml variability, +accels, no decels TOCO: no decels   A/P: iup at 34 wga with severe pre-eclampsia 1. IOL - plan cytotec/pitocin; process reviewed with patient and no questions; understands risk of fetal intolerance of labor and need for c/s 2. Severe pre-e: begin magnesium sulfate and contin at least 24 hrs after delivery; monitor for s/s toxicity; repeat pih labs now; mild range bps and will follow closely 3. Fetal status-reassuring, will follow closely; efw 11/1: 3'12" (10%), nml UAD; bpp 1 day ago 8/8 4. Prematurity: s/p rescue bmz 11/10, 11/11 5. gbs negative

## 2020-02-16 NOTE — Progress Notes (Signed)
Transferred to L&D, room 212 via wheel chair with significant other.  Report called by Rondel Jumbo, RN

## 2020-02-17 ENCOUNTER — Encounter (HOSPITAL_COMMUNITY): Payer: Self-pay | Admitting: Obstetrics and Gynecology

## 2020-02-17 LAB — CBC
HCT: 40.1 % (ref 36.0–46.0)
HCT: 35.3 % — ABNORMAL LOW (ref 36.0–46.0)
Hemoglobin: 13.8 g/dL (ref 12.0–15.0)
Hemoglobin: 11.9 g/dL — ABNORMAL LOW (ref 12.0–15.0)
MCH: 31.2 pg (ref 26.0–34.0)
MCH: 30.6 pg (ref 26.0–34.0)
MCHC: 34.4 g/dL (ref 30.0–36.0)
MCHC: 33.7 g/dL (ref 30.0–36.0)
MCV: 90.7 fL (ref 80.0–100.0)
MCV: 90.7 fL (ref 80.0–100.0)
Platelets: 213 10*3/uL (ref 150–400)
Platelets: 181 10*3/uL (ref 150–400)
RBC: 4.42 MIL/uL (ref 3.87–5.11)
RBC: 3.89 MIL/uL (ref 3.87–5.11)
RDW: 13.2 % (ref 11.5–15.5)
RDW: 13.2 % (ref 11.5–15.5)
WBC: 16.8 10*3/uL — ABNORMAL HIGH (ref 4.0–10.5)
WBC: 15.9 10*3/uL — ABNORMAL HIGH (ref 4.0–10.5)
nRBC: 0.1 % (ref 0.0–0.2)
nRBC: 0.1 % (ref 0.0–0.2)

## 2020-02-17 MED ORDER — ONDANSETRON HCL 4 MG PO TABS: 4.0000 mg | ORAL_TABLET | ORAL | Status: DC | PRN

## 2020-02-17 MED ORDER — TRANEXAMIC ACID-NACL 1000-0.7 MG/100ML-% IV SOLN
INTRAVENOUS | Status: AC
  Filled 2020-02-17: qty 100

## 2020-02-17 MED ORDER — TRANEXAMIC ACID-NACL 1000-0.7 MG/100ML-% IV SOLN
1000.0000 mg | INTRAVENOUS | Status: AC
  Administered 2020-02-17: 1000 mg via INTRAVENOUS

## 2020-02-17 MED ORDER — ONDANSETRON HCL 4 MG/2ML IJ SOLN: 4.0000 mg | INTRAMUSCULAR | Status: DC | PRN

## 2020-02-17 MED ORDER — METHYLERGONOVINE MALEATE 0.2 MG PO TABS: 0.2000 mg | ORAL_TABLET | ORAL | Status: DC | PRN

## 2020-02-17 MED ORDER — OXYCODONE-ACETAMINOPHEN 5-325 MG PO TABS: 1.0000 | ORAL_TABLET | ORAL | Status: DC | PRN

## 2020-02-17 MED ORDER — METHYLERGONOVINE MALEATE 0.2 MG/ML IJ SOLN: 0.2000 mg | INTRAMUSCULAR | Status: DC | PRN

## 2020-02-17 MED ORDER — DIBUCAINE (PERIANAL) 1 % EX OINT: 1.0000 "application " | TOPICAL_OINTMENT | CUTANEOUS | Status: DC | PRN

## 2020-02-17 MED ORDER — DIPHENHYDRAMINE HCL 25 MG PO CAPS: 25.0000 mg | ORAL_CAPSULE | Freq: Four times a day (QID) | ORAL | Status: DC | PRN

## 2020-02-17 MED ORDER — ZOLPIDEM TARTRATE 5 MG PO TABS: 5.0000 mg | ORAL_TABLET | Freq: Every evening | ORAL | Status: DC | PRN

## 2020-02-17 MED ORDER — PRENATAL MULTIVITAMIN CH
1.0000 | ORAL_TABLET | Freq: Every day | ORAL | Status: DC
  Administered 2020-02-17 – 2020-02-21 (×5): 1 via ORAL
  Filled 2020-02-17 (×5): qty 1

## 2020-02-17 MED ORDER — WITCH HAZEL-GLYCERIN EX PADS: 1.0000 "application " | MEDICATED_PAD | CUTANEOUS | Status: DC | PRN

## 2020-02-17 MED ORDER — LACTATED RINGERS IV SOLN: INTRAVENOUS | Status: DC

## 2020-02-17 MED ORDER — IBUPROFEN 600 MG PO TABS
600.0000 mg | ORAL_TABLET | Freq: Four times a day (QID) | ORAL | Status: DC
  Administered 2020-02-17 – 2020-02-21 (×17): 600 mg via ORAL
  Filled 2020-02-17 (×17): qty 1

## 2020-02-17 MED ORDER — BENZOCAINE-MENTHOL 20-0.5 % EX AERO
1.0000 "application " | INHALATION_SPRAY | CUTANEOUS | Status: DC | PRN
  Administered 2020-02-17: 1 via TOPICAL
  Filled 2020-02-17: qty 56

## 2020-02-17 MED ORDER — OXYCODONE-ACETAMINOPHEN 5-325 MG PO TABS: 2.0000 | ORAL_TABLET | ORAL | Status: DC | PRN

## 2020-02-17 MED ORDER — COCONUT OIL OIL
1.0000 "application " | TOPICAL_OIL | Status: DC | PRN
  Administered 2020-02-17: 1 via TOPICAL

## 2020-02-17 MED ORDER — ACETAMINOPHEN 325 MG PO TABS
650.0000 mg | ORAL_TABLET | ORAL | Status: DC | PRN
  Administered 2020-02-17: 650 mg via ORAL
  Filled 2020-02-17: qty 2

## 2020-02-17 MED ORDER — TETANUS-DIPHTH-ACELL PERTUSSIS 5-2.5-18.5 LF-MCG/0.5 IM SUSY: 0.5000 mL | PREFILLED_SYRINGE | Freq: Once | INTRAMUSCULAR | Status: DC

## 2020-02-17 MED ORDER — SIMETHICONE 80 MG PO CHEW: 80.0000 mg | CHEWABLE_TABLET | ORAL | Status: DC | PRN

## 2020-02-17 MED ORDER — SENNOSIDES-DOCUSATE SODIUM 8.6-50 MG PO TABS
2.0000 | ORAL_TABLET | ORAL | Status: DC
  Administered 2020-02-17 – 2020-02-21 (×4): 2 via ORAL
  Filled 2020-02-17 (×4): qty 2

## 2020-02-17 NOTE — Anesthesia Postprocedure Evaluation (Signed)
Anesthesia Post Note  Patient: Samantha [name]  Procedure(s) Performed: AN AD HOC LABOR EPIDURAL     Patient location during evaluation: Mother Baby Anesthesia Type: Epidural Level of consciousness: awake and alert and oriented Pain management: satisfactory to patient Vital Signs Assessment: post-procedure vital signs reviewed and stable Respiratory status: respiratory function stable Cardiovascular status: stable Postop Assessment: no headache, no backache, epidural receding, patient able to bend at knees, no signs of nausea or vomiting, adequate PO intake and able to ambulate Anesthetic complications: no   No complications documented.  Last Vitals:  Vitals:   02/17/20 1422 02/17/20 1507  BP:  132/76  Pulse:  77  Resp: 18 17  Temp:  37.1 C  SpO2:  97%    Last Pain:  Vitals:   02/17/20 1507  TempSrc: Oral  PainSc:    Pain Goal: Patients Stated Pain Goal: 3 (02/17/20 0827)                 Karleen Dolphin

## 2020-02-17 NOTE — Lactation Note (Signed)
This note was copied from a baby's chart. Lactation Consultation Note  Patient Name: Samantha [name] Today's Date: 02/17/2020 Reason for consult: Initial assessment  Initial visit to P1 mother of 15 hours old preterm infant currently in NICU. Mother states main goal to exclusively breastfeed this infant. DEBP has been set up already. Mother verbalizes understanding in regards to pump basics/cleaning/frequency and milk storage. Mother explains she has already use the pump twice. Mother is still receiving Mg infusion.   Reviewed Breastfeeding a NICU baby booklet and lactation services information with mother and encouraged to contact [facility] for support and questions.  Mother is not a Engineer, technical sales [place] [facility] participant. Offered to submit a referral will be fax on baby's behalf.    Maternal Data Formula Feeding for Exclusion: No Has patient been taught Hand Expression?: No Does the patient have breastfeeding experience prior to this delivery?: No  Feeding Feeding Type: Formula  Interventions Interventions: Breast feeding basics reviewed;DEBP  Lactation Tools Discussed/Used WIC Program: No (LC will submit referral)   Consult Status Consult Status: Follow-up Date: 02/18/20 Follow-up type: In-patient    Rannie Craney A Higuera Ancidey 02/17/2020, 7:21 PM

## 2020-02-17 NOTE — Plan of Care (Signed)
  Problem: Clinical Measurements: Goal: Complications related to the disease process, condition or treatment will be avoided or minimized Outcome: Completed/Met   Problem: Education: Goal: Knowledge of Childbirth will improve Outcome: Completed/Met Goal: Ability to make informed decisions regarding treatment and plan of care will improve Outcome: Completed/Met Goal: Ability to state and carry out methods to decrease the pain will improve Outcome: Completed/Met Goal: Individualized Educational Video(s) Outcome: Completed/Met   Problem: Coping: Goal: Ability to verbalize concerns and feelings about labor and delivery will improve Outcome: Completed/Met   Problem: Life Cycle: Goal: Ability to make normal progression through stages of labor will improve Outcome: Completed/Met Goal: Ability to effectively push during vaginal delivery will improve Outcome: Completed/Met   Problem: Role Relationship: Goal: Will demonstrate positive interactions with the child Outcome: Completed/Met   Problem: Safety: Goal: Risk of complications during labor and delivery will decrease Outcome: Completed/Met   Problem: Pain Management: Goal: Relief or control of pain from uterine contractions will improve Outcome: Completed/Met

## 2020-02-17 NOTE — Progress Notes (Signed)
Samantha [name] is a 34 y.o. G1P0 at [redacted]w[redacted]d by LMP admitted for induction of labor due to Pre-eclamptic toxemia of pregnancy..  Subjective: comfortable  Objective: BP (!) 152/93    Pulse 81    Temp 97.6 F (36.4 C) (Oral)    Resp 16    Ht 5\' 7"  (1.702 m)    Wt 118.3 kg    SpO2 95%    BMI 40.85 kg/m  I/O last 3 completed shifts: In: 3087.3 [P.O.:520; I.V.:2567.3] Out: 3000 [Urine:3000] Total I/O In: -  Out: 405 [Urine:405]  FHT:  FHR: 140 bpm, variability: moderate,  accelerations:  Present,  decelerations:  Absent UC:   irregular, every 2-4 minutes SVE:   Dilation: 4 Effacement (%): 80 Station: -3 Exam by:: [email or web address], RN  Labs: Lab Results  Component Value Date   WBC 11.8 (H) 02/16/2020   HGB 12.1 02/16/2020   HCT 35.2 (L) 02/16/2020   MCV 89.6 02/16/2020   PLT 184 02/16/2020    Assessment / Plan: Induction of labor due to preeclampsia,  progressing well on pitocin  Labor: Progressing normally. Prolonged latent phase Preeclampsia:  on magnesium sulfate, no signs or symptoms of toxicity, intake and ouput balanced and labs stable Fetal Wellbeing:  Category I and Category II Pain Control:  Epidural I/D:  n/a Anticipated MOD:  guarded  Samantha [name] 02/17/2020, 12:37 AM

## 2020-02-17 NOTE — Lactation Note (Signed)
Lactation Consultation Note  Patient Name: Samantha [name] ERDEY'C Date: 02/17/2020   Do not see at this time, per RN. Mother too sleepy. LC will plan visit later today.   Elder Negus 02/17/2020, 10:30 AM

## 2020-02-18 NOTE — Progress Notes (Addendum)
HD 14 PEC with severe features PPD 1  S: No complaints. Denies HA , CP or SOB. No visual changes. Minimal bleeding  O: BP (!) 143/86 (BP Location: Right Arm)   Pulse 83   Temp 98.4 F (36.9 C) (Oral)   Resp 18   Ht 5\' 7"  (1.702 m)   Wt 118.3 kg   SpO2 100%   Breastfeeding Unknown   BMI 40.85 kg/m   NCAT HEENT : nl Neck: supple with FROM Lungs: CTA, NO WRR CV: RRR ABD: NT, No RUQ tenderness No CVAT EXT: 2+ pretibial edema, DTRs 2+ Neuro: nonfocal Skin: intact  CBC    Component Value Date/Time   WBC 15.9 (H) 02/17/2020 0930   RBC 3.89 02/17/2020 0930   HGB 11.9 (L) 02/17/2020 0930   HCT 35.3 (L) 02/17/2020 0930   PLT 181 02/17/2020 0930   MCV 90.7 02/17/2020 0930   MCH 30.6 02/17/2020 0930   MCHC 33.7 02/17/2020 0930   RDW 13.2 02/17/2020 0930   LYMPHSABS 2.2 02/15/2020 0613   MONOABS 0.7 02/15/2020 0613   EOSABS 0.0 02/15/2020 0613   BASOSABS 0.0 02/15/2020 0613   CMP     Component Value Date/Time   NA 135 02/16/2020 0205   K 3.9 02/16/2020 0205   CL 105 02/16/2020 0205   CO2 20 (L) 02/16/2020 0205   GLUCOSE 103 (H) 02/16/2020 0205   BUN 19 02/16/2020 0205   CREATININE 0.94 02/16/2020 0205   CALCIUM 8.5 (L) 02/16/2020 0205   PROT 5.2 (L) 02/16/2020 0205   ALBUMIN 2.0 (L) 02/16/2020 0205   AST 21 02/16/2020 0205   ALT 14 02/16/2020 0205   ALKPHOS 118 02/16/2020 0205   BILITOT 0.5 02/16/2020 0205   GFRNONAA >60 02/16/2020 0205      IMP:  PPD 1 PEC with severe features (BP criteria). Nl labs. Off Mag today   P:  Labetalol 300mg  tid Nifedipine 30mg  XL bid  Watch BP

## 2020-02-18 NOTE — Lactation Note (Signed)
This note was copied from a baby's chart. Lactation Consultation Note  Patient Name: Samantha [name] Today's Date: 02/18/2020 Reason for consult: Follow-up assessment;Late-preterm 34-36.6wks;NICU baby   Mom resting in bed.  Dad encouraged mom to pump while LC was at the bedside.  Mom states she has some pain in the nipple after pumping.  LC brought pump to bedside.  24 appeared to rub.  Flange size increased to 27 with coconut oil used on nipple and inside flange while mom pumped.  27 was comfortable and a better fit.  Mom has edema around the nipple and nipples are shorter shafted and slightly flat.    LC reviewed hand expression, milk collection and storage guidelines.  Mom was encouraged to pump every 2-3 hours for a goal of 8x in 24 hours.  Boxes were drawn on white board for family to cross out after each pump for the 24 hour period.    Mom is on Mag.    All questions answered and LC praised mom for pumping efforts and congratulated her on baby Samantha [name].  Maternal Data Has patient been taught Hand Expression?: Yes Does the patient have breastfeeding experience prior to this delivery?: No  Feeding Feeding Type: Donor Breast Milk  LATCH Score                   Interventions Interventions: DEBP  Lactation Tools Discussed/Used Tools: Flanges Flange Size: 27   Consult Status Consult Status: Follow-up Date: 02/19/20 Follow-up type: In-patient    [name] 02/18/2020, 8:30 AM

## 2020-02-19 MED ORDER — NIFEDIPINE ER OSMOTIC RELEASE 30 MG PO TB24
30.0000 mg | ORAL_TABLET | ORAL | Status: DC
  Administered 2020-02-19 – 2020-02-20 (×2): 30 mg via ORAL
  Filled 2020-02-19 (×2): qty 1

## 2020-02-19 MED ORDER — LABETALOL HCL 200 MG PO TABS
400.0000 mg | ORAL_TABLET | Freq: Three times a day (TID) | ORAL | Status: DC
  Administered 2020-02-20 – 2020-02-21 (×5): 400 mg via ORAL
  Filled 2020-02-19 (×5): qty 2

## 2020-02-19 MED ORDER — NIFEDIPINE ER OSMOTIC RELEASE 30 MG PO TB24
60.0000 mg | ORAL_TABLET | ORAL | Status: DC
  Administered 2020-02-20 – 2020-02-21 (×2): 60 mg via ORAL
  Filled 2020-02-19 (×2): qty 2

## 2020-02-19 MED ORDER — HYDROCHLOROTHIAZIDE 25 MG PO TABS
25.0000 mg | ORAL_TABLET | Freq: Every day | ORAL | Status: DC
  Administered 2020-02-19 – 2020-02-21 (×3): 25 mg via ORAL
  Filled 2020-02-19 (×4): qty 1

## 2020-02-19 MED ORDER — NIFEDIPINE ER OSMOTIC RELEASE 30 MG PO TB24
30.0000 mg | ORAL_TABLET | Freq: Once | ORAL | Status: AC
  Administered 2020-02-19: 30 mg via ORAL
  Filled 2020-02-19: qty 1

## 2020-02-19 NOTE — Lactation Note (Signed)
This note was copied from a baby's chart. Lactation Consultation Note  Patient Name: Samantha [name] Today's Date: 02/19/2020 Reason for consult: Follow-up assessment   P1 mother whose infant is now 92 hours old.  This is a LPTI at 34+1 weeks with a CGA of 34+3 weeks weighing < 4 lbs and in the NICU.  Mother had no questions/concerns regarding pumping.  She has been pumping approximately every three hours.  She is familiar with hand expression.  Encouraged breast massage and hand expression before/after pumping to help establish a good milk supply.  Mother has not been able to obtain any colostrum yet with pumping and is aware to continue being diligent with pumping.  She denies pain with pumping.  Asked about her daughter and mother stated, "She is fine."  Father present and they are having a phone conversation with a family member.  Encouraged mother to call me for any questions/concersn.   Maternal Data    Feeding Feeding Type: Donor Breast Milk  LATCH Score                   Interventions    Lactation Tools Discussed/Used     Consult Status Consult Status: Follow-up Date: 02/20/20 Follow-up type: In-patient    [name] 02/19/2020, 10:49 AM

## 2020-02-19 NOTE — Progress Notes (Signed)
Patient screened out for psychosocial assessment since none of the following apply:  Psychosocial stressors documented in mother or baby's chart  Gestation less than 32 weeks  Code at delivery   Infant with anomalies Please contact the Clinical Social Worker if specific needs arise, by MOB's request, or if MOB scores greater than 9/yes to question 10 on Edinburgh Postpartum Depression Screen.  Melenda Bielak, LCSW Clinical Social Worker [facility] Cell#: [phone]     

## 2020-02-19 NOTE — Progress Notes (Signed)
Hospital day #15.  Preeclampsia with severe features Postpartum day #2  Subjective: Patient notes pain at perineal laceration site.  Moderate lochia.  Taking nonnarcotics for pain.  Has not yet used sitz bath.  Patient denies headache, right upper quadrant pain, uterine pain, chest pain, shortness of breath, scotomata.  Patient notes out of bed without difficulty.  Voiding frequently.  Tolerating regular p.o.  Objective: Vitals with BMI 02/19/2020 02/19/2020 02/18/2020  Height - - -  Weight - - -  BMI - - -  Systolic 148 157 588  Diastolic 80 86 78  Pulse 77 86 80   General: Well-appearing, no distress Cardiovascular: Regular rate and rhythm Pulmonary: Clear to auscultation bilaterally, no wheezes Abdomen: Nontender, no edema, no right upper quadrant pain, fundus firm and below the umbilicus GU: Obstertrical laceration evaluated, intact Lower extremity: 1+ DTR, 2+ pitting edema  CBC    Component Value Date/Time   WBC 15.9 (H) 02/17/2020 0930   RBC 3.89 02/17/2020 0930   HGB 11.9 (L) 02/17/2020 0930   HCT 35.3 (L) 02/17/2020 0930   PLT 181 02/17/2020 0930   MCV 90.7 02/17/2020 0930   MCH 30.6 02/17/2020 0930   MCHC 33.7 02/17/2020 0930   RDW 13.2 02/17/2020 0930   LYMPHSABS 2.2 02/15/2020 0613   MONOABS 0.7 02/15/2020 0613   EOSABS 0.0 02/15/2020 0613   BASOSABS 0.0 02/15/2020 0613    Assessment and plan: 34 year old G1 P1 postpartum day 2 from spontaneous vaginal delivery after induction of labor for severe preeclampsia.  Patient recovering well from a postpartum standpoint.  Her blood pressures remain elevated with significant edema but no other symptoms of preeclampsia.  Labs have been stable and were not repeated today.  Given suboptimal control of her blood pressure will add additional 30 mg of Procardia in the a.m. so that patient will be on 60 mg XL Procardia in the a.m., 30 mg in the p.m. and labetalol 300 mg every 8 hours.  Given her edema will go and 25 mg of  hydrochlorothiazide for 5 weeks.  Baby doing well NICU  Anticipate discharge home tomorrow  Lendon Colonel 02/19/2020 11:39 AM

## 2020-02-20 MED ORDER — HYDRALAZINE HCL 50 MG PO TABS
25.0000 mg | ORAL_TABLET | Freq: Every day | ORAL | Status: DC
  Administered 2020-02-20 – 2020-02-21 (×2): 25 mg via ORAL
  Filled 2020-02-20 (×2): qty 1

## 2020-02-20 NOTE — Progress Notes (Signed)
Post Partum Day 3 HD #16 Subjective: Patient doing well this morning. Only complaint is some mild perineal pain. States lochia has slowed down, moderate flow. Ambulating without dizziness. Spontaneously voiding. Tolerating regular diet, denies N/V. Reports mild HA right sided overnight, resolved with pain medication, returned this morning but very mild, has not yet had pain medication. Denies any associated vision changes. Denies any CP or SOB. Denies any RUQ or epigastric pain. Reports swelling in hands and feet have decreased since yesterday. Baby girl is doing well in NICU. Patient interested in going home today if possible. FOB at bedside, supportive and attentive.  Objective: Patient Vitals for the past 24 hrs:  BP Temp Temp src Pulse Resp SpO2  02/20/20 0815 (!) 147/84 98.1 F (36.7 C) Oral 79 19 98 %  02/20/20 0408 (!) 155/89 98 F (36.7 C) Oral 89 20 98 %  02/19/20 2326 133/78 98 F (36.7 C) Oral 84 -- 98 %  02/19/20 2325 -- -- -- -- -- 98 %  02/19/20 1930 (!) 149/86 98.2 F (36.8 C) Oral 84 18 97 %  02/19/20 1621 (!) 152/94 98.2 F (36.8 C) Oral 80 17 96 %  02/19/20 1303 (!) 144/94 -- -- 80 -- --  02/19/20 1248 (!) 163/93 98.1 F (36.7 C) Oral 83 17 98 %    Physical Exam:  General: alert Lochia: appropriate Heart: RRR Lungs: CTABL no rales or crackles Uterine Fundus: firm DVT Evaluation: No evidence of DVT seen on physical exam. Calf/Ankle edema is present +2 b/l  Neuro +2 DTR's b/l LE  No results for input(s): WBC, HGB, HCT, PLT in the last 72 hours.  No results for input(s): NA, K, CL, CO2CT, BUN, CREATININE, GLUCOSE, BILITOT, ALT, AST, ALKPHOS, PROT, ALBUMIN in the last 72 hours.  No results for input(s): CALCIUM, MG, PHOS in the last 72 hours.  No results for input(s): PROTIME, APTT, INR in the last 72 hours.  No results for input(s): PROTIME, APTT, INR, FIBRINOGEN in the last 72 hours. Assessment/Plan:  Samantha [name] 34 y.o. G1P0101 PPD#3 sp SVD @ 34.1 for  preE w/ SF. She is now s/p 24hr of PP Mag. Labs had remained stable during antepartum period, with no indication to repeat PP. Patient has remained asymptomatic with exception of mild HA but responsive to Ibuprofen and without any additional neurologic symptoms. PP antihypertensive adjustments, currently well controlled on PO Labetalol increased to 400 mg q 8hr this morning, Procardia 60XL in AM and 30XL in PM, and HCTZ 25 mg daily. Plan to monitor BP closely this afternoon, if continued to be well controlled, plan for discharge this evening. Patient aware if BP's severe range or necessitate additional antihypertensive dosing adjustments would recommend continued inpatient stay.  1. PPC: routine PP care with Ibuprofen/Tylenol PRN pain regimen, regular diet, and encouraged ambulation 2. RH POS, Rubella Immune   LOS: 15 days   Audrena Talaga A Kaydyn Chism 02/20/2020, 12:28 PM

## 2020-02-20 NOTE — Lactation Note (Signed)
This note was copied from a baby's chart. Lactation Consultation Note  Patient Name: Samantha [name] Today's Date: 02/20/2020 Reason for consult: Follow-up assessment;NICU baby  LC to room for follow up visit.  Mom pumping with good volume of colostrum. Provided additional labels and collection containers. Patient was provided with the opportunity to ask questions. All concerns were addressed.  Will plan follow up visit.   Consult Status Consult Status: Follow-up Date: 02/21/20    Elder Negus 02/20/2020, 10:36 AM

## 2020-02-21 MED ORDER — IBUPROFEN 600 MG PO TABS: 600.0000 mg | ORAL_TABLET | Freq: Four times a day (QID) | ORAL | 0 refills | Status: AC

## 2020-02-21 MED ORDER — NIFEDIPINE ER 30 MG PO TB24: 30.0000 mg | ORAL_TABLET | Freq: Every day | ORAL | 0 refills | Status: AC

## 2020-02-21 MED ORDER — ACETAMINOPHEN 325 MG PO TABS: 650.0000 mg | ORAL_TABLET | ORAL | 0 refills | Status: AC | PRN

## 2020-02-21 MED ORDER — NIFEDIPINE ER 60 MG PO TB24: 60.0000 mg | ORAL_TABLET | Freq: Every morning | ORAL | 1 refills | Status: AC

## 2020-02-21 MED ORDER — HYDRALAZINE HCL 50 MG PO TABS
25.0000 mg | ORAL_TABLET | Freq: Three times a day (TID) | ORAL | Status: DC
  Administered 2020-02-21: 25 mg via ORAL
  Filled 2020-02-21: qty 1

## 2020-02-21 MED ORDER — HYDROCHLOROTHIAZIDE 25 MG PO TABS: 25.0000 mg | ORAL_TABLET | Freq: Every day | ORAL | 0 refills | Status: AC

## 2020-02-21 MED ORDER — HYDRALAZINE HCL 25 MG PO TABS: 25.0000 mg | ORAL_TABLET | Freq: Three times a day (TID) | ORAL | 0 refills | Status: AC

## 2020-02-21 MED ORDER — LABETALOL HCL 200 MG PO TABS: 400.0000 mg | ORAL_TABLET | Freq: Three times a day (TID) | ORAL | 0 refills | Status: AC

## 2020-02-21 NOTE — Lactation Note (Signed)
This note was copied from a baby's chart. Lactation Consultation Note  Patient Name: Samantha [name] Today's Date: 02/21/2020 Reason for consult: Follow-up assessment;Primapara;1st time breastfeeding;NICU baby;Late-preterm 34-36.6wks;Infant < 6lbs  LC in to visit with P1 Mom of LPTI in the NICU.  Baby is 74 days old and Mom has been consistently double pumping.  Mom needs to call [facility] and let them know if she is being discharged today, to pick up [facility] pump.  Volume is increasing now, breasts are filling, but not engorged.  Mom taking Moringa to support her milk supply.  Encouraged consistent pumping.  Mom knows how to assemble the hand pump.  Reviewed washing pump parts and rinsing and air drying in separate bin.  Storage bottles provided. Mom has several colostrum containers filled with EBM to take to NICU for baby.   Mom aware of lactation support for her while baby is in NICU.   Interventions Interventions: Breast feeding basics reviewed;Skin to skin;Breast massage;Hand express;DEBP;Hand pump  Lactation Tools Discussed/Used Tools: Pump Breast pump type: Double-Electric Breast Pump   Consult Status Consult Status: Follow-up Date: 02/26/20 Follow-up type: In-patient    Samantha [name] 02/21/2020, 1:12 PM

## 2020-02-21 NOTE — Discharge Instructions (Signed)
Postpartum Hypertension Postpartum hypertension is high blood pressure that remains higher than normal after childbirth. You may not realize that you have postpartum hypertension if your blood pressure is not being checked regularly. In most cases, postpartum hypertension will go away on its own, usually within a week of delivery. However, for some women, medical treatment is required to prevent serious complications, such as seizures or stroke. What are the causes? This condition may be caused by one or more of the following:  Hypertension that existed before pregnancy (chronic hypertension).  Hypertension that comes on as a result of pregnancy (gestational hypertension).  Hypertensive disorders during pregnancy (preeclampsia) or seizures in women who have high blood pressure during pregnancy (eclampsia).  A condition in which the liver, platelets, and red blood cells are damaged during pregnancy (HELLP syndrome).  A condition in which the thyroid produces too much hormones (hyperthyroidism).  Other rare problems of the nerves (neurological disorders) or blood disorders. In some cases, the cause may not be known. What increases the risk? The following factors may make you more likely to develop this condition:  Chronic hypertension. In some cases, this may not have been diagnosed before pregnancy.  Obesity.  Type 2 diabetes.  Kidney disease.  History of preeclampsia or eclampsia.  Other medical conditions that change the level of hormones in the body (hormonal imbalance). What are the signs or symptoms? As with all types of hypertension, postpartum hypertension may not have any symptoms. Depending on how high your blood pressure is, you may experience:  Headaches. These may be mild, moderate, or severe. They may also be steady, constant, or sudden in onset (thunderclap headache).  Changes in your ability to see (visual changes).  Dizziness.  Shortness of breath.  Swelling  of your hands, feet, lower legs, or face. In some cases, you may have swelling in more than one of these locations.  Heart palpitations or a racing heartbeat.  Difficulty breathing while lying down.  Decrease in the amount of urine that you pass. Other rare signs and symptoms may include:  Sweating more than usual. This lasts longer than a few days after delivery.  Chest pain.  Sudden dizziness when you get up from sitting or lying down.  Seizures.  Nausea or vomiting.  Abdominal pain. How is this diagnosed? This condition may be diagnosed based on the results of a physical exam, blood pressure measurements, and blood and urine tests. You may also have other tests, such as a CT scan or an MRI, to check for other problems of postpartum hypertension. How is this treated? If blood pressure is high enough to require treatment, your options may include:  Medicines to reduce blood pressure (antihypertensives). Tell your health care provider if you are breastfeeding or if you plan to breastfeed. There are many antihypertensive medicines that are safe to take while breastfeeding.  Stopping medicines that may be causing hypertension.  Treating medical conditions that are causing hypertension.  Treating the complications of hypertension, such as seizures, stroke, or kidney problems. Your health care provider will also continue to monitor your blood pressure closely until it is within a safe range for you. Follow these instructions at home:  Take over-the-counter and prescription medicines only as told by your health care provider.  Return to your normal activities as told by your health care provider. Ask your health care provider what activities are safe for you.  Do not use any products that contain nicotine or tobacco, such as cigarettes and e-cigarettes. If   you need help quitting, ask your health care provider.  Keep all follow-up visits as told by your health care provider. This  is important. Contact a health care provider if:  Your symptoms get worse.  You have new symptoms, such as: ? A headache that does not get better. ? Dizziness. ? Visual changes. Get help right away if:  You suddenly develop swelling in your hands, ankles, or face.  You have sudden, rapid weight gain.  You develop difficulty breathing, chest pain, racing heartbeat, or heart palpitations.  You develop severe pain in your abdomen.  You have any symptoms of a stroke. "BE FAST" is an easy way to remember the main warning signs of a stroke: ? B - Balance. Signs are dizziness, sudden trouble walking, or loss of balance. ? E - Eyes. Signs are trouble seeing or a sudden change in vision. ? F - Face. Signs are sudden weakness or numbness of the face, or the face or eyelid drooping on one side. ? A - Arms. Signs are weakness or numbness in an arm. This happens suddenly and usually on one side of the body. ? S - Speech. Signs are sudden trouble speaking, slurred speech, or trouble understanding what people say. ? T - Time. Time to call emergency services. Write down what time symptoms started.  You have other signs of a stroke, such as: ? A sudden, severe headache with no known cause. ? Nausea or vomiting. ? Seizure. These symptoms may represent a serious problem that is an emergency. Do not wait to see if the symptoms will go away. Get medical help right away. Call your local emergency services (911 in the [place] Do not drive yourself to the hospital. Summary  Postpartum hypertension is high blood pressure that remains higher than normal after childbirth.  In most cases, postpartum hypertension will go away on its own, usually within a week of delivery.  For some women, medical treatment is required to prevent serious complications, such as seizures or stroke. This information is not intended to replace advice given to you by your health care provider. Make sure you discuss any questions  you have with your health care provider. Document Revised: 04/29/2018 Document Reviewed: 01/11/2017 Elsevier Patient Education  2020 [employer]  

## 2020-02-21 NOTE — Progress Notes (Signed)
Post Partum Day 4, IOL 34 wks for Severe Preeclampsia HD #17 Subjective: No complaints. Feels well. Breast milk coming in well, lactation assisting No HA/ vision changes/ RUQ pain/ CP/ SOB. Swelling decreased a lot.   Objective: Patient Vitals for the past 24 hrs:  BP Temp Temp src Pulse Resp SpO2  02/21/20 1146 (!) 147/97 98.7 F (37.1 C) Oral 79 (!) 99 99 %  02/21/20 0822 (!) 152/94 98.3 F (36.8 C) Oral 80 18 100 %  02/21/20 0440 140/84 98.3 F (36.8 C) Oral 81 20 98 %  02/20/20 2303 (!) 148/76 98 F (36.7 C) Oral 87 16 99 %  02/20/20 1914 (!) 144/86 97.9 F (36.6 C) Oral 85 18 97 %  02/20/20 1544 (!) 141/81 98.7 F (37.1 C) Oral 82 18 98 %   Physical Exam:  General: alert, NAD Lochia: appropriate Heart: RRR Lungs: CTABL no rales or crackles Uterine Fundus: firm  4 below U DVT Evaluation: No evidence of DVT seen on physical exam. Swelling minimal ankles Neuro +2 DTR's b/l LE  Assessment/Plan:  Samantha [name] 34 y.o. G1P0101 PPD#4 sp SVD @ 34.1 for Preeclampsia with severe range BPs.  BP nopt perfect but much improved, current regimen- PO Labetalol 400 mg q 8hrs, Procardia 60XL in AM and 30XL in PM, and HCTZ 25 mg daily (2 more days) and added Hydralazine 25mg  PO yesterday but will increase to 25 mg PO TID with Labetalol dosing.  Since no severe range BPs in several says, will discharge today and f/up in office with me on 11/22 -Mon.  Warning ss and PP care and pericare discussed.  Moringa extract for lactation support  03-22-1967 02/21/2020, 12:34 PM

## 2020-02-21 NOTE — Discharge Summary (Signed)
Postpartum Discharge Summary   Patient Name: Samantha [name] DOB: [birth year] MRN: [id number]  Date of admission: 02/05/2020 Preterm Preeclampsia at 32 weeks, severe range BPs and severe proteinuria at 11 gm  Date of discharge: 02/21/2020, Preeclampsia, postpartum, hypertension on multiple anti-hypertensive meds but stable  Delivery date:02/17/2020  after labor induction on 11/12 and preterm SVD on 02/17/20 Delivering provider: Chipper Herb course:  34 yo G1, admitted at 32.3 wks (dating by LMP c/w 1st trim sono) for preeclampsia with severe range BPs and proteinuria of 11 gm. She had received BTMZ 2 days prior to admission at 32, 32.1 wks due to mild elevated BP in 140/90 range and protein dip with +2 protein. Since admission she was given magnesium for seizure prophylaxis for 48hrs, started on anti Hypertensive meds- Procardia, Labetalol, doses titrated up as needed to Procardia 30mg  PO BID, Labetalol 400 mg TID prior to delivery. She had MFM and NICU consults, Growth sono noted baby at 10% Vx, UAD nl. Twice/ wkly BPP 8/8. Daily NST TID- reactive, twice/wkly PIH labs- CBC, CMP, daily I/O, weights and close monitoring. A repeat dose of BTMZ was given at 33.5, 33.6 wks to prepare for 34 wks labir induction. She received magnesium for seizure prophylaxis at admission and then restarted when labor was induced at 34 wks.  She tolerated magnesium well. Weight loss noted after admission and again postpartum.  Her final antiHTN meds were Procardia 60mg  XL in AM, 30mg  XL in PM, Labetalol 400mg  TID and Hydralazine 25 mg TID. Patient was asymptomatic and agrees with close follow up Baby girl stable in NICU  Physical exam  Vitals:   02/20/20 2303 02/21/20 0440 02/21/20 0822 02/21/20 1146  BP: (!) 148/76 140/84 (!) 152/94 (!) 147/97  Pulse: 87 81 80 79  Resp: 16 20 18  (!) 99  Temp: 98 F (36.7 C) 98.3 F (36.8 C) 98.3 F (36.8 C) 98.7 F (37.1 C)  TempSrc: Oral Oral Oral Oral   SpO2: 99% 98% 100% 99%  Weight:      Height:       Physical Exam:  General: alert, NAD Lochia: appropriate Heart: RRR Lungs: CTABL no rales or crackles Uterine Fundus: firm  4 below U DVT Evaluation: No evidence of DVT seen on physical exam. Swelling minimal ankles Neuro +2 DTR's b/l LE   Labs: CBC Latest Ref Rng & Units 02/17/2020 02/17/2020 02/16/2020  WBC 4.0 - 10.5 K/uL 15.9(H) 16.8(H) 11.8(H)  Hemoglobin 12.0 - 15.0 g/dL 11.9(L) 13.8 12.1  Hematocrit 36 - 46 % 35.3(L) 40.1 35.2(L)  Platelets 150 - 400 K/uL 181 213 184   CMP Latest Ref Rng & Units 02/16/2020 02/15/2020 02/12/2020  Glucose 70 - 99 mg/dL 02/19/2020) 13/03/2020) 74  BUN 6 - 20 mg/dL 19 15 13   Creatinine 0.44 - 1.00 mg/dL 13/03/2020 13/02/2020 13/11/2019  Sodium 135 - 145 mmol/L 135 137 136  Potassium 3.5 - 5.1 mmol/L 3.9 4.0 4.1  Chloride 98 - 111 mmol/L 105 109 106  CO2 22 - 32 mmol/L 20(L) 21(L) 22  Calcium 8.9 - 10.3 mg/dL 376(E) 8.3(L) 8.5(L)  Total Protein 6.5 - 8.1 g/dL 5.2(L) 4.9(L) 4.9(L)  Total Bilirubin 0.3 - 1.2 mg/dL 0.5 0.6 0.7  Alkaline Phos 38 - 126 U/L 118 116 99  AST 15 - 41 U/L 21 21 14(L)  ALT 0 - 44 U/L 14 12 9     A/P: Samantha [name] 34 y.o. G1P0101 PPD#4 sp SVD @ 34.1 for Preeclampsia with severe  range BPs.  BP not perfect but much improved.  Current regimen Oral Labetalol 400 mg every 8 hrs  Oral Hydralazine 25 mg every 8 hours   Oral Procardia 60XL in AM and 30XL in PM/ at bedtime Oral HCTZ 25 mg daily for 3 more days for swelling Iron supplement, Prenatal Vitamin daily Ibuprofen and Tylenol if needed for pain- cramps and pain in vaginal tear   Edinburgh Score: Edinburgh Postnatal Depression Scale Screening Tool 02/20/2020  I have been able to laugh and see the funny side of things. 0  I have looked forward with enjoyment to things. 0  I have blamed myself unnecessarily when things went wrong. 0  I have been anxious or worried for no good reason. 2  I have felt scared or panicky for no good reason.  0  Things have been getting on top of me. 0  I have been so unhappy that I have had difficulty sleeping. 1  I have felt sad or miserable. 0  I have been so unhappy that I have been crying. 0  The thought of harming myself has occurred to me. 0  Edinburgh Postnatal Depression Scale Total 3      After visit meds:  Allergies as of 02/21/2020   No Known Allergies      Medication List     TAKE these medications    acetaminophen 325 MG tablet Commonly known as: Tylenol Take 2 tablets (650 mg total) by mouth every 4 (four) hours as needed (for pain scale < 4).   FeroSul 325 (65 FE) MG tablet Generic drug: ferrous sulfate TAKE ONE TABLET BY MOUTH ONE TIME DAILY WITH VITAMIN C OR ORANGE JUICE. USE COLACE AS NEEDED FOR CONSTIPATION   hydrALAZINE 25 MG tablet Commonly known as: APRESOLINE Take 1 tablet (25 mg total) by mouth every 8 (eight) hours.   hydrochlorothiazide 25 MG tablet Commonly known as: HYDRODIURIL Take 1 tablet (25 mg total) by mouth daily for 3 days. Start taking on: February 22, 2020   ibuprofen 600 MG tablet Commonly known as: ADVIL Take 1 tablet (600 mg total) by mouth every 6 (six) hours.   labetalol 200 MG tablet Commonly known as: NORMODYNE Take 2 tablets (400 mg total) by mouth every 8 (eight) hours.   multivitamin-prenatal 27-0.8 MG Tabs tablet Take 1 tablet by mouth daily at 12 noon.   NIFEdipine 30 MG 24 hr tablet Commonly known as: ADALAT CC Take 1 tablet (30 mg total) by mouth at bedtime.   NIFEdipine 60 MG 24 hr tablet Commonly known as: ADALAT CC Take 1 tablet (60 mg total) by mouth in the morning.        Discharge home in stable condition Infant Feeding: Breast , NICU baby, stable Infant Disposition:NICU Discharge instruction: per After Visit Summary and Postpartum booklet. Activity: Advance as tolerated. Pelvic rest for 6 weeks.  Diet: low salt diet Anticipated Birth Control: Unsure Postpartum Appointment:02/26/20 for BP  check Additional Postpartum F/U:  6 wks    Follow up Visit:  Follow-up Information     Shea Evans, MD Follow up on 02/26/2020.   Specialty: Obstetrics and Gynecology Why: for BP check Contact information: [address] [place] [address] [phone]                  02/21/2020 Robley Fries, MD

## 2020-02-24 ENCOUNTER — Ambulatory Visit: Payer: Self-pay

## 2020-02-24 NOTE — Lactation Note (Signed)
This note was copied from a baby's chart. Lactation Consultation Note  Patient Name: Samantha [name] Today's Date: 02/24/2020 Reason for consult: Follow-up assessment;NICU baby  LC to room for follow up visit.  Mom continues to pump q 3 hours. She has some breast fullness that is relieved by massage and moist heat. She does not need storage containers or labels at this time. Parents are aware of LC services. Will plan follow up visit.    Consult Status Consult Status: Follow-up Date: 02/25/20 Follow-up type: In-patient    Elder Negus 02/24/2020, 2:31 PM

## 2020-02-26 ENCOUNTER — Ambulatory Visit: Payer: Self-pay

## 2020-02-26 NOTE — Lactation Note (Signed)
This note was copied from a baby's chart. Lactation Consultation Note  Patient Name: Girl Verta Riedlinger Today's Date: 02/26/2020 Reason for consult: Follow-up assessment  LC Follow Up Visit:  P1 mother whose infant is now 62 days old.  This is a LPTI at 34+1 weeks with a CGA of 35+3 weeks and in the NICU.  Baby was asleep in mother's arms when I arrived.  Mother had one question regarding pumping.  She stated that her nipples hurt when she begins to pump.  Discussed pump settings with her and she has been turning the suction pressure up to a very high level.  She stated the pain eases after approximately 1-2 minutes.  Education provided on how to regulate pump settings and to observe for nipple movement in the flange during pumping.  Mother stated the nipple does not rub inside the flange during pumping.  Advised using coconut oil for comfort.  Mother does not have nipple breakdown.  She is pumping every three hours during the day and every four hours during the night.  Volumes are approximately 240 mls/session.  Praised mother for her continued pumping.  Support person present.   Maternal Data    Feeding Feeding Type: Donor Breast Milk Nipple Type: Nfant Extra Slow Flow (gold)  LATCH Score                   Interventions    Lactation Tools Discussed/Used     Consult Status Consult Status: Follow-up Date: 02/27/20 Follow-up type: In-patient    Dora Sims 02/26/2020, 3:44 PM

## 2020-02-29 ENCOUNTER — Ambulatory Visit: Payer: Self-pay

## 2020-02-29 NOTE — Lactation Note (Signed)
This note was copied from a baby's chart. Lactation Consultation Note  Patient Name: Samantha [name] Today's Date: 02/29/2020 Reason for consult: Follow-up assessment;NICU baby  Infant to d/c today. Mother continues to pump and practice bf. We reviewed developmental readiness to bf and I encouraged mother to practice bf daily followed by pumping/bottle feeding until baby is able to bf effectively. Patient was provided with the opportunity to ask questions. All concerns were addressed.     Consult Status Consult Status: Follow-up Date: 03/01/20 Follow-up type: In-patient    Elder Negus 02/29/2020, 4:40 PM

## 2020-02-29 NOTE — Lactation Note (Signed)
This note was copied from a baby's chart. Lactation Consultation Note  Patient Name: Samantha [name] Today's Date: 02/29/2020   Infant to d/c today. Parents coming to hospital at 1600, per RN. LC will f/u at that time with d/c info.    Elder Negus 02/29/2020, 3:20 PM

## 2020-03-15 ENCOUNTER — Other Ambulatory Visit (HOSPITAL_BASED_OUTPATIENT_CLINIC_OR_DEPARTMENT_OTHER): Payer: Self-pay

## 2020-03-15 DIAGNOSIS — R0683 Snoring: Secondary | ICD-10-CM

## 2020-04-11 ENCOUNTER — Encounter (HOSPITAL_BASED_OUTPATIENT_CLINIC_OR_DEPARTMENT_OTHER): Payer: 59 | Admitting: Internal Medicine

## 2020-05-19 ENCOUNTER — Ambulatory Visit (HOSPITAL_BASED_OUTPATIENT_CLINIC_OR_DEPARTMENT_OTHER): Payer: Medicaid Other | Attending: Internal Medicine | Admitting: Internal Medicine

## 2020-05-19 ENCOUNTER — Other Ambulatory Visit (HOSPITAL_BASED_OUTPATIENT_CLINIC_OR_DEPARTMENT_OTHER): Payer: Self-pay

## 2021-07-14 ENCOUNTER — Other Ambulatory Visit (HOSPITAL_COMMUNITY): Payer: Self-pay | Admitting: Otolaryngology

## 2021-07-14 ENCOUNTER — Other Ambulatory Visit: Payer: Self-pay | Admitting: Otolaryngology

## 2021-07-14 DIAGNOSIS — J320 Chronic maxillary sinusitis: Secondary | ICD-10-CM

## 2021-07-23 ENCOUNTER — Ambulatory Visit (HOSPITAL_COMMUNITY): Payer: 59

## 2021-07-30 ENCOUNTER — Ambulatory Visit (HOSPITAL_COMMUNITY)
Admission: RE | Admit: 2021-07-30 | Discharge: 2021-07-30 | Disposition: A | Discharge status: Home / Self Care | Payer: 59 | Source: Ambulatory Visit | Attending: Otolaryngology | Admitting: Otolaryngology

## 2021-07-30 ENCOUNTER — Encounter (HOSPITAL_COMMUNITY): Payer: Self-pay

## 2021-07-30 DIAGNOSIS — J320 Chronic maxillary sinusitis: Secondary | ICD-10-CM | POA: Insufficient documentation

## 2021-11-04 DIAGNOSIS — G4700 Insomnia, unspecified: Secondary | ICD-10-CM | POA: Diagnosis not present

## 2021-11-04 DIAGNOSIS — J301 Allergic rhinitis due to pollen: Secondary | ICD-10-CM | POA: Diagnosis not present

## 2021-11-04 DIAGNOSIS — E782 Mixed hyperlipidemia: Secondary | ICD-10-CM | POA: Diagnosis not present

## 2021-11-04 DIAGNOSIS — I10 Essential (primary) hypertension: Secondary | ICD-10-CM | POA: Diagnosis not present

## 2021-11-27 DIAGNOSIS — R052 Subacute cough: Secondary | ICD-10-CM | POA: Diagnosis not present

## 2022-01-09 DIAGNOSIS — Z319 Encounter for procreative management, unspecified: Secondary | ICD-10-CM | POA: Diagnosis not present

## 2022-02-09 DIAGNOSIS — I10 Essential (primary) hypertension: Secondary | ICD-10-CM | POA: Diagnosis not present

## 2022-02-09 DIAGNOSIS — E782 Mixed hyperlipidemia: Secondary | ICD-10-CM | POA: Diagnosis not present

## 2022-02-09 DIAGNOSIS — G4700 Insomnia, unspecified: Secondary | ICD-10-CM | POA: Diagnosis not present

## 2022-02-09 DIAGNOSIS — J301 Allergic rhinitis due to pollen: Secondary | ICD-10-CM | POA: Diagnosis not present

## 2022-03-23 DIAGNOSIS — J329 Chronic sinusitis, unspecified: Secondary | ICD-10-CM | POA: Diagnosis not present

## 2022-03-23 DIAGNOSIS — Z5986 Financial insecurity: Secondary | ICD-10-CM | POA: Diagnosis not present

## 2022-03-23 DIAGNOSIS — I10 Essential (primary) hypertension: Secondary | ICD-10-CM | POA: Diagnosis not present

## 2022-03-23 DIAGNOSIS — E669 Obesity, unspecified: Secondary | ICD-10-CM | POA: Diagnosis not present

## 2022-03-23 DIAGNOSIS — Z6834 Body mass index (BMI) 34.0-34.9, adult: Secondary | ICD-10-CM | POA: Diagnosis not present

## 2022-07-10 DIAGNOSIS — Z3149 Encounter for other procreative investigation and testing: Secondary | ICD-10-CM | POA: Diagnosis not present

## 2022-07-10 DIAGNOSIS — Z319 Encounter for procreative management, unspecified: Secondary | ICD-10-CM | POA: Diagnosis not present

## 2022-07-22 DIAGNOSIS — Z6831 Body mass index (BMI) 31.0-31.9, adult: Secondary | ICD-10-CM | POA: Diagnosis not present

## 2022-07-22 DIAGNOSIS — E669 Obesity, unspecified: Secondary | ICD-10-CM | POA: Diagnosis not present

## 2022-07-22 DIAGNOSIS — I10 Essential (primary) hypertension: Secondary | ICD-10-CM | POA: Diagnosis not present

## 2022-07-22 DIAGNOSIS — J301 Allergic rhinitis due to pollen: Secondary | ICD-10-CM | POA: Diagnosis not present

## 2022-08-04 DIAGNOSIS — N970 Female infertility associated with anovulation: Secondary | ICD-10-CM | POA: Diagnosis not present

## 2024-03-26 IMAGING — CT CT MAXILLOFACIAL W/O CM
3 series · 14 of 47 positions shown, 16 images · non-contrast
Comparison: None.

CLINICAL DATA: Snoring, sore throat and drainage

EXAM:
CT MAXILLOFACIAL WITHOUT CONTRAST
TECHNIQUE: Multidetector CT images of the paranasal sinuses were obtained using
the standard protocol without intravenous contrast.
RADIATION DOSE REDUCTION: This exam was performed according to the
departmental dose-optimization program which includes automated
exposure control, adjustment of the mA and/or kV according to
patient size and/or use of iterative reconstruction technique.

[Series 3: max soft · axial · 0.37mm/px · z∈[-230,-102]mm · 8 of 76 slices shown, 10 images]
[im 6/76  brain]
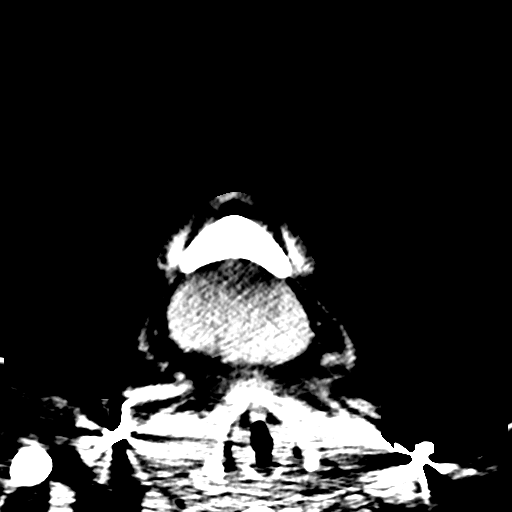
[im 6/76  bone]
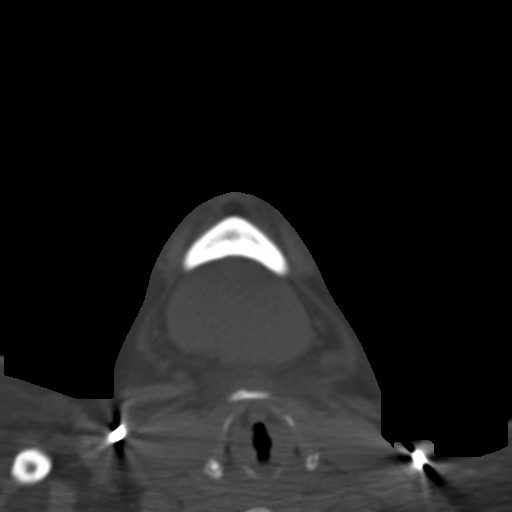
[im 16/76  bone]
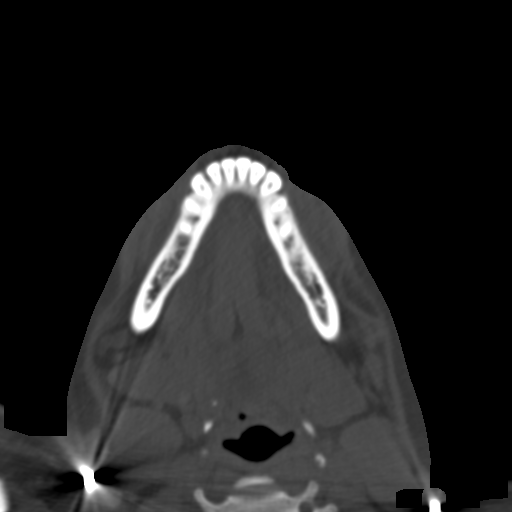
[im 24/76  bone]
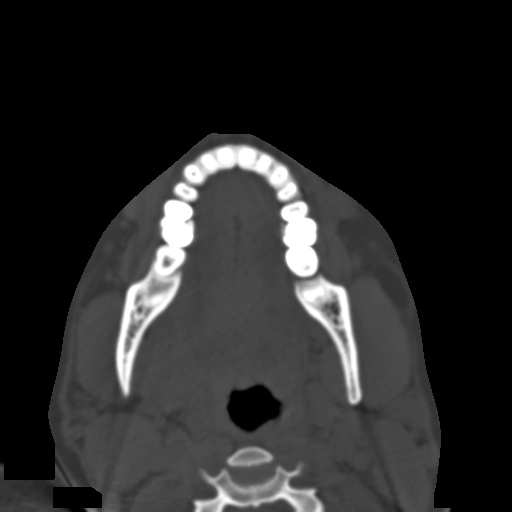
[im 34/76  bone]
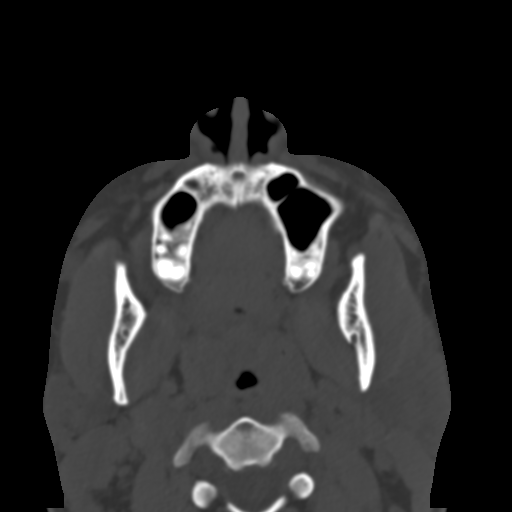
[im 42/76  brain]
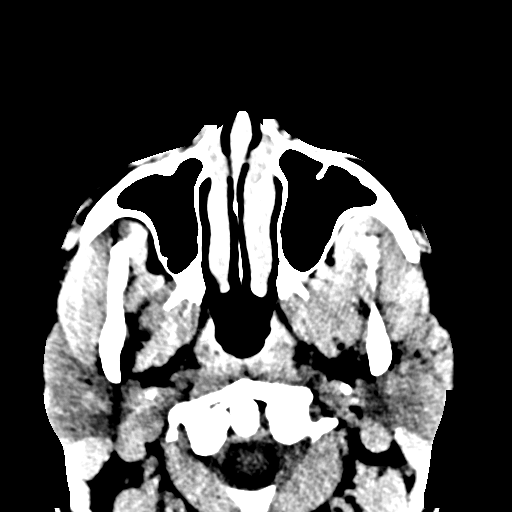
[im 42/76  bone]
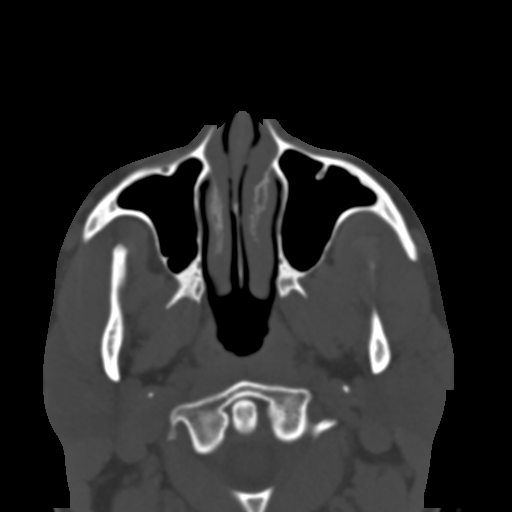
[im 52/76  bone]
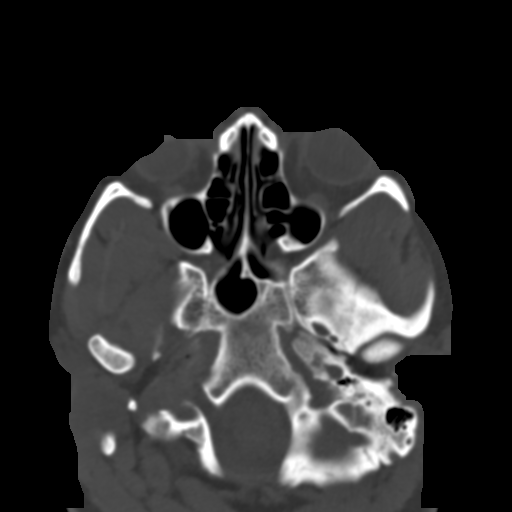
[im 60/76  bone]
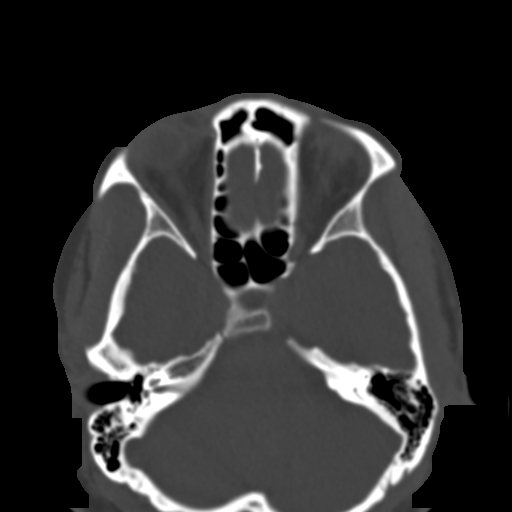
[im 70/76  bone]
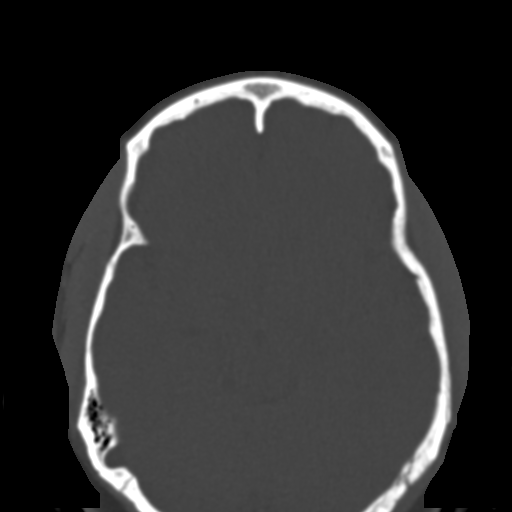

[Series 7: coronal soft · coronal · 0.30mm/px · 3 of 87 slices shown]
[im 29/87  bone]
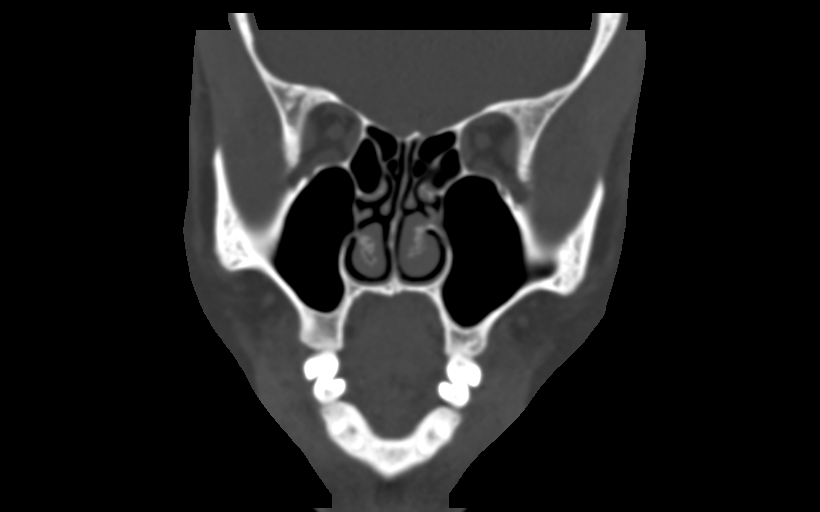
[im 39/87  bone]
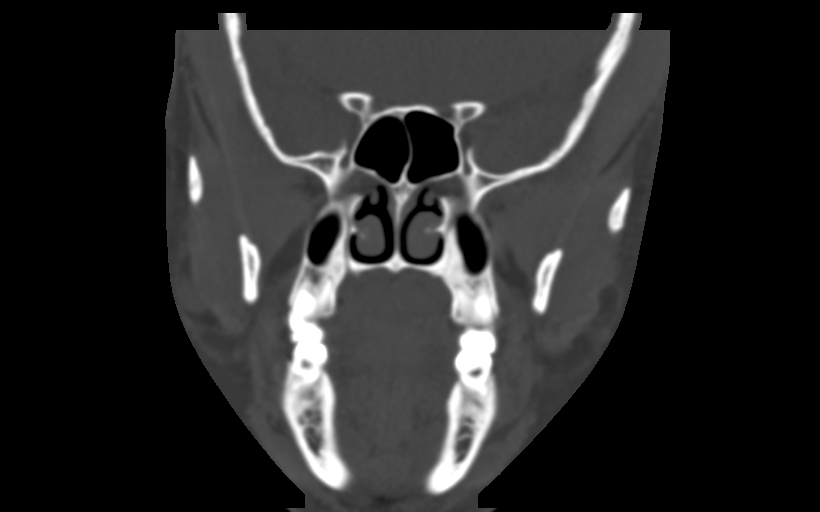
[im 48/87  bone]
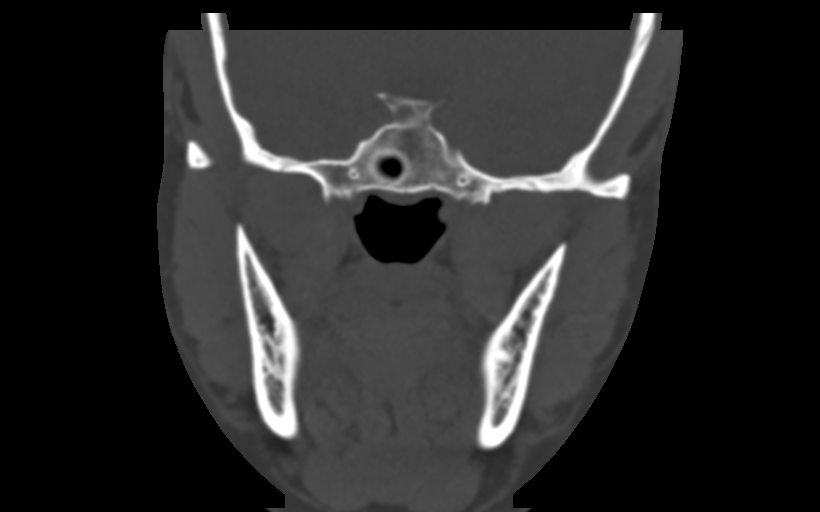

[Series 8: sagittal soft · sagittal · 0.33mm/px · 3 of 94 slices shown]
[im 32/94  bone]
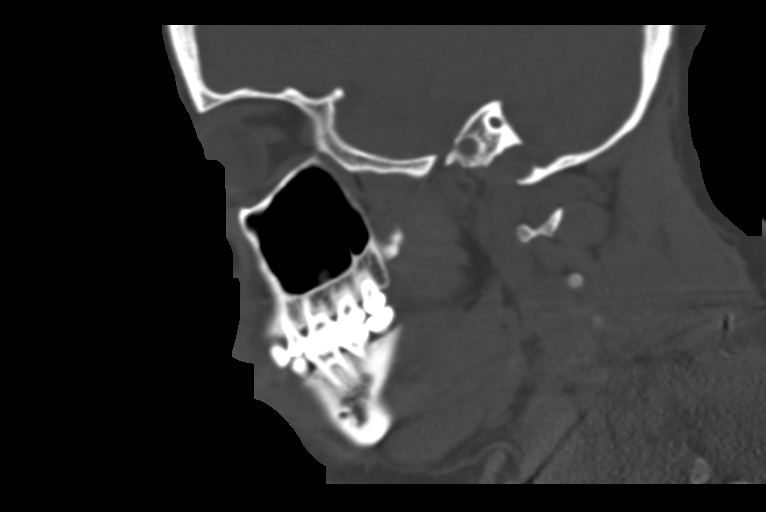
[im 47/94  bone]
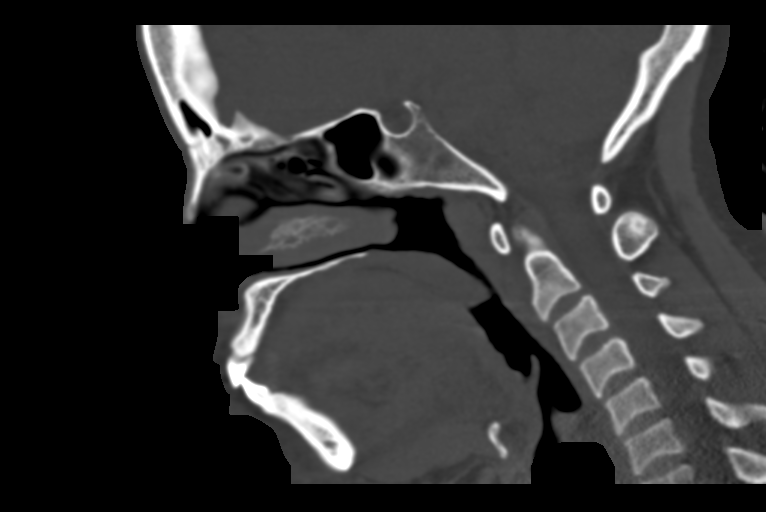
[im 63/94  bone]
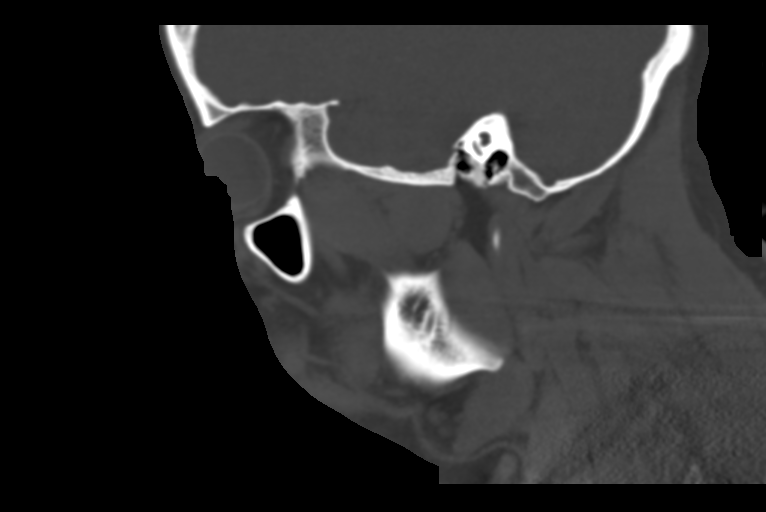

[14 of 47 positions shown; findings below may reference images not displayed]

FINDINGS: Paranasal sinuses:

Frontal: Normally aerated. Patent frontal sinus drainage pathways.

Ethmoid: Normally aerated.

Maxillary: Mild mucosal thickening in the inferior right maxillary
sinus. Otherwise normally aerated. No evidence of osseous thickening
or erosion.

Sphenoid: Normally aerated. Sphenoethmoidal recesses are occluded
due to minimal mucosal thickening.

Right ostiomeatal unit: Patent.

Left ostiomeatal unit: Occluded, with mucosal thickening at the
ostium. Otherwise patent.

Nasal passages: Patent. Intact nasal septum is midline.

Anatomy: There is minimal pneumatization superior to the right
anterior ethmoid notch. Symmetric and intact olfactory grooves and
fovea ethmoidalis, Keros II (4-7mm). Presellar sphenoid
pneumatization pattern. No dehiscence of carotid or optic canals. No
onodi cell.

Other: Orbits and intracranial compartment are unremarkable. Visible
mastoid air cells are normally aerated.
IMPRESSION: Mild mucosal thickening in the inferior right maxillary sinus, with
additional minimal mucosal thickening occluding the ostium of the
left ostiomeatal complex and at the sphenoethmoidal recesses,
without evidence of acute or chronic sinusitis.
